# Patient Record
Sex: Male | Born: 1960 | Race: Black or African American | Hispanic: No | Marital: Married | Smoking: Former smoker
Health system: Southern US, Community
[De-identification: ages and names within clinical notes are randomized; demographics above are authoritative.]

## PROBLEM LIST (undated history)

## (undated) DIAGNOSIS — N200 Calculus of kidney: Secondary | ICD-10-CM

## (undated) DIAGNOSIS — K219 Gastro-esophageal reflux disease without esophagitis: Secondary | ICD-10-CM

## (undated) DIAGNOSIS — D126 Benign neoplasm of colon, unspecified: Secondary | ICD-10-CM

## (undated) DIAGNOSIS — I10 Essential (primary) hypertension: Secondary | ICD-10-CM

## (undated) DIAGNOSIS — E785 Hyperlipidemia, unspecified: Secondary | ICD-10-CM

## (undated) DIAGNOSIS — F17200 Nicotine dependence, unspecified, uncomplicated: Secondary | ICD-10-CM

## (undated) DIAGNOSIS — T7840XA Allergy, unspecified, initial encounter: Secondary | ICD-10-CM

## (undated) DIAGNOSIS — L409 Psoriasis, unspecified: Secondary | ICD-10-CM

## (undated) DIAGNOSIS — B001 Herpesviral vesicular dermatitis: Secondary | ICD-10-CM

## (undated) DIAGNOSIS — J302 Other seasonal allergic rhinitis: Secondary | ICD-10-CM

## (undated) DIAGNOSIS — I639 Cerebral infarction, unspecified: Secondary | ICD-10-CM

## (undated) HISTORY — DX: Calculus of kidney: N20.0

## (undated) HISTORY — DX: Essential (primary) hypertension: I10

## (undated) HISTORY — DX: Herpesviral vesicular dermatitis: B00.1

## (undated) HISTORY — PX: NASAL SINUS SURGERY: SHX719

## (undated) HISTORY — DX: Gastro-esophageal reflux disease without esophagitis: K21.9

## (undated) HISTORY — DX: Nicotine dependence, unspecified, uncomplicated: F17.200

## (undated) HISTORY — DX: Psoriasis, unspecified: L40.9

## (undated) HISTORY — PX: KIDNEY STONE SURGERY: SHX686

## (undated) HISTORY — DX: Other seasonal allergic rhinitis: J30.2

## (undated) HISTORY — DX: Allergy, unspecified, initial encounter: T78.40XA

## (undated) HISTORY — DX: Hyperlipidemia, unspecified: E78.5

## (undated) HISTORY — DX: Benign neoplasm of colon, unspecified: D12.6

## (undated) HISTORY — DX: Cerebral infarction, unspecified: I63.9

---

## 2003-10-11 ENCOUNTER — Emergency Department (HOSPITAL_COMMUNITY): Admission: EM | Admit: 2003-10-11 | Discharge: 2003-10-12 | Payer: Self-pay | Admitting: Emergency Medicine

## 2006-01-19 ENCOUNTER — Ambulatory Visit: Payer: Self-pay | Admitting: Family Medicine

## 2006-01-26 ENCOUNTER — Ambulatory Visit: Payer: Self-pay | Admitting: Family Medicine

## 2006-04-04 ENCOUNTER — Ambulatory Visit: Payer: Self-pay | Admitting: Family Medicine

## 2006-05-30 ENCOUNTER — Ambulatory Visit: Payer: Self-pay | Admitting: Family Medicine

## 2007-07-26 ENCOUNTER — Ambulatory Visit: Payer: Self-pay | Admitting: Family Medicine

## 2008-04-08 ENCOUNTER — Ambulatory Visit: Payer: Self-pay | Admitting: Family Medicine

## 2008-04-22 ENCOUNTER — Ambulatory Visit: Payer: Self-pay | Admitting: Family Medicine

## 2008-07-24 ENCOUNTER — Ambulatory Visit: Payer: Self-pay | Admitting: Family Medicine

## 2008-11-18 ENCOUNTER — Ambulatory Visit: Payer: Self-pay | Admitting: Family Medicine

## 2008-11-25 ENCOUNTER — Ambulatory Visit: Payer: Self-pay | Admitting: Family Medicine

## 2008-11-26 ENCOUNTER — Encounter: Admission: RE | Admit: 2008-11-26 | Discharge: 2008-11-26 | Payer: Self-pay | Admitting: Family Medicine

## 2008-12-11 ENCOUNTER — Ambulatory Visit: Payer: Self-pay | Admitting: Family Medicine

## 2008-12-30 ENCOUNTER — Ambulatory Visit: Payer: Self-pay | Admitting: Family Medicine

## 2008-12-31 ENCOUNTER — Emergency Department (HOSPITAL_COMMUNITY): Admission: EM | Admit: 2008-12-31 | Discharge: 2008-12-31 | Payer: Self-pay | Admitting: Emergency Medicine

## 2008-12-31 ENCOUNTER — Encounter: Admission: RE | Admit: 2008-12-31 | Discharge: 2008-12-31 | Payer: Self-pay | Admitting: Family Medicine

## 2009-01-23 ENCOUNTER — Ambulatory Visit (HOSPITAL_BASED_OUTPATIENT_CLINIC_OR_DEPARTMENT_OTHER): Admission: RE | Admit: 2009-01-23 | Discharge: 2009-01-23 | Payer: Self-pay | Admitting: Urology

## 2010-02-11 ENCOUNTER — Ambulatory Visit: Payer: Self-pay | Admitting: Family Medicine

## 2010-02-22 ENCOUNTER — Encounter: Admission: RE | Admit: 2010-02-22 | Discharge: 2010-02-22 | Payer: Self-pay | Admitting: Family Medicine

## 2010-02-22 ENCOUNTER — Ambulatory Visit: Payer: Self-pay | Admitting: Family Medicine

## 2010-06-08 ENCOUNTER — Ambulatory Visit: Payer: Self-pay | Admitting: Family Medicine

## 2010-07-17 ENCOUNTER — Encounter: Payer: Self-pay | Admitting: Otolaryngology

## 2010-09-16 ENCOUNTER — Ambulatory Visit (INDEPENDENT_AMBULATORY_CARE_PROVIDER_SITE_OTHER): Payer: 59 | Admitting: Family Medicine

## 2010-10-03 LAB — URINALYSIS, ROUTINE W REFLEX MICROSCOPIC
Bilirubin Urine: NEGATIVE
Glucose, UA: NEGATIVE mg/dL
Ketones, ur: NEGATIVE mg/dL
Leukocytes, UA: NEGATIVE
Nitrite: NEGATIVE
Protein, ur: NEGATIVE mg/dL
Specific Gravity, Urine: 1.045 — ABNORMAL HIGH (ref 1.005–1.030)
Urobilinogen, UA: 1 mg/dL (ref 0.0–1.0)
pH: 7 (ref 5.0–8.0)

## 2010-10-03 LAB — POCT I-STAT, CHEM 8
BUN: 18 mg/dL (ref 6–23)
Calcium, Ion: 1.15 mmol/L (ref 1.12–1.32)
Chloride: 99 meq/L (ref 96–112)
Creatinine, Ser: 2.2 mg/dL — ABNORMAL HIGH (ref 0.4–1.5)
Glucose, Bld: 106 mg/dL — ABNORMAL HIGH (ref 70–99)
HCT: 39 % (ref 39.0–52.0)
Hemoglobin: 13.3 g/dL (ref 13.0–17.0)
Potassium: 3.3 meq/L — ABNORMAL LOW (ref 3.5–5.1)
Sodium: 138 meq/L (ref 135–145)
TCO2: 29 mmol/L (ref 0–100)

## 2010-10-03 LAB — POCT I-STAT 4, (NA,K, GLUC, HGB,HCT)
Hemoglobin: 13.6 g/dL (ref 13.0–17.0)
Potassium: 3.5 mEq/L (ref 3.5–5.1)
Sodium: 142 mEq/L (ref 135–145)

## 2010-10-03 LAB — URINE MICROSCOPIC-ADD ON

## 2010-11-09 NOTE — Consult Note (Signed)
Jason Robbins, Jason Robbins                 ACCOUNT NO.:  [id number]   MEDICAL RECORD NO.:  [id number]          PATIENT TYPE:  EMS   LOCATION:  ED                           FACILITY:  [facility]   PHYSICIAN:  Sigmund I. Patsi Sears, M.D.DATE OF BIRTH:  08/06/1960   DATE OF CONSULTATION:  12/31/2008  DATE OF DISCHARGE:                                 CONSULTATION   SUBJECTIVE:  Jason Robbins is a 50 year old male, with a history of elevated  cholesterol and hypertension, seen at the [facility] ER  complaining of several days of right flank pain.  The patient has known  calcified gallstone, thought his pain was from the gallstone, but CT  scan shows the patient has a 7 mm right lower ureteral calculus, with  mild hydronephrosis.  Because the patient has a creatinine of 2.2, he  was referred to [facility] Emergency Room was slow emergency room for  evaluation.  The patient has had currently has no nausea, no vomiting.  He has a pain level of 3/10, which has been consistent all day.  He  drinks no soft drinks, no alcohol.  Tobacco:  He  is a current smoker  within the last 1 year.  There is no illicit drug use.   REVIEW OF SYSTEMS:  Significant for flank pain without radiation in the  right side.  The patient has no diarrhea.  No dysuria.  No fever, no  chills.  No gross hematuria.   PHYSICAL EXAMINATION:  Well-developed, well-nourished African American  male in no acute distress.  VITAL SIGNS:  Blood pressure is 170/106, pulse 97, respiratory 20,  temperature is 100.3.  NECK:  Supple, nontender.  CHEST:  Clear to P&A.  ABDOMEN:  Soft, decreased bowel sounds without are organomegaly or  masses.  There is right flank pain.  GENITOURINARY:  Normal.  EXTREMITIES:  Normal.   LABORATORY DATA:  Urinalysis with 0-2 red cells per high-power field.  CBC shows hemoglobin 13.3, hematocrit 39.  BMET shows sodium 138,  potassium 3.3, chloride 99, BUN 80, creatinine 2.2, glucose 106.   IMPRESSION:   Right lower ureteral calculus measuring 7 mm with mild  hydronephrosis, mild to moderate symptoms.  The patient will be treated  with medical expulsive therapy with Flomax, and IV Toradol in the  emergency room,.  He will be followed up in the office on Monday, for  consideration of ureteroscopy and laser fragmentation of stone.  He will  follow up with Dr. Lindie Spruce with regard to his elevated blood pressure in  the emergency room, as well as creatinine of 2.2.  Otherwise, the  patient appears stable.      Sigmund I. Patsi Sears, M.D.  Electronically Signed     SIT/MEDQ  D:  12/31/2008  T:  01/01/2009  Job:  [id number]   cc:   Sharlot Gowda, M.D.  Fax: [phone]

## 2010-11-09 NOTE — Op Note (Signed)
Jason Robbins, Jason Robbins                 ACCOUNT NO.:  [id number]   MEDICAL RECORD NO.:  [id number]          PATIENT TYPE:  AMB   LOCATION:  [facility]                         FACILITY:  [facility]   PHYSICIAN:  Sigmund I. Patsi Sears, M.D.DATE OF BIRTH:  1961/03/12   DATE OF PROCEDURE:  01/23/2009  DATE OF DISCHARGE:                               OPERATIVE REPORT   PREOPERATIVE DIAGNOSIS:  Impacted right lower ureteral calculus.   POSTOPERATIVE DIAGNOSIS:  Impacted right lower ureteral calculus.   OPERATION:  Cystourethroscopy, right retrograde pyelogram  interpretation, right ureteroscopy, laser fragmentation of right distal  ureteral calculus, basket extraction of fragments of distal ureteral  calculus stone, right double-J stent (6 French x 24 cm).   SURGEON:  Tannenbaum.   ANESTHESIA:  General LMA.   PREPARATION:  After appropriate preanesthesia, the patient was brought  to the operating room and placed on the operating room in dorsal supine  position where general LMA anesthesia was introduced.  He was then  replaced in dorsal lithotomy position where the pubis was prepped with  Betadine solution and draped in usual fashion.   REVIEW OF HISTORY:  Jason Robbins is a 50 year old male with a known right  lower ureteral calculus, which has failed to progress, continues to  cause intermittent right flank and right lower quadrant pain with some  urinary intermittency.  He has been followed for several weeks on  medical expulsive therapy, but has failed, and is now for ureteroscopy  and removal of stone.   PROCEDURE:  Cystourethroscopy was accomplished, which shows a normal-  appearing bladder, with normal trigone.  The left ureter was normal.  Left retrograde pyelogram was performed which shows a normal ureter with  no stones within it.  The right UO was distinctly abnormal and appears  to be quite edematous.  It is identified, however, and retrograde  pyelogram reveals a 7-mm stone in the ureteral  tunnel.  Ureteroscopy is  then accomplished and a stone identified.  It appeared to be rectangular  in shape, and laser fragmentation was accomplished to break the stone  into multiple pieces.  A basket was then used to remove the stone  fragments [facility] scientific stone basket).  Following this, repeat  ureteroscopy revealed no stones in the ureter.  Because of the general  edema of the ureteral orifice and the postsurgical edema of the ureter  which is anticipated, I will place a double-J stent.  Therefore, a 6-  [place] x 24-cm right double-J stent was placed under fluoroscopic  control in the renal pelvis and coiled in the bladder.  The suture was  taped to the patient's penis.  The patient was given IV Toradol and had  previously been given B&O suppository.  He was awakened and taken to  recovery room in good condition.     Sigmund I. Patsi Sears, M.D.  Electronically Signed    SIT/MEDQ  D:  01/23/2009  T:  01/24/2009  Job:  [id number]

## 2011-01-19 ENCOUNTER — Encounter: Payer: Self-pay | Admitting: Family Medicine

## 2011-01-19 ENCOUNTER — Ambulatory Visit (INDEPENDENT_AMBULATORY_CARE_PROVIDER_SITE_OTHER): Payer: 59 | Admitting: Medical

## 2011-01-19 ENCOUNTER — Encounter: Payer: Self-pay | Admitting: Medical

## 2011-01-19 VITALS — BP 122/88 | HR 100 | Temp 98.7°F | Ht 72.0 in | Wt 251.0 lb

## 2011-01-19 DIAGNOSIS — R509 Fever, unspecified: Secondary | ICD-10-CM

## 2011-01-19 DIAGNOSIS — J029 Acute pharyngitis, unspecified: Secondary | ICD-10-CM

## 2011-01-19 DIAGNOSIS — J039 Acute tonsillitis, unspecified: Secondary | ICD-10-CM

## 2011-01-19 MED ORDER — AMOXICILLIN 875 MG PO TABS: 875.0000 mg | ORAL_TABLET | Freq: Two times a day (BID) | ORAL | Status: AC

## 2011-01-19 NOTE — Progress Notes (Signed)
  Subjective:     Jason Robbins is a 50 y.o. male who presents for 3 day hx/o malaise.  He started feeling bad Monday with malaise, fever to 101, chills, and took some Nyquil.  By Tuesday had sore throat, sinus pressure, on and off fever, and feeling of post nasal drip.  Sister was in town recently with same symptoms.   He has continued to use OTC sinus medication, but just doesn't feel good.   The following portions of the patient's history were reviewed and updated as appropriate: allergies, current medications, past family history, past medical history, past social history, past surgical history and problem list.  Past Medical History  Diagnosis Date  . Allergy     RHINITIS  . Psoriasis   . Cholelithiasis   . Renal stone   . Dyslipidemia   . Herpes labialis   . Hypertension   . Smoker     Review of Systems Constitutional: +fever, chills, fatigue;  denies sweats Dermatology: denies rash ENT: no runny nose, ear pain, teeth pain Cardiology: denies chest pain, palpitations Respiratory: +slight occasional cough; denies shortness of breath, wheezing,  Gastroenterology: denies abdominal pain, nausea, vomiting, diarrhea Musculoskeletal: denies arthralgias, myalgias  Objective:     Filed Vitals:   01/19/11 0832  BP: 122/88  Pulse: 100  Temp: 98.7 F (37.1 C)    General appearance: no distress, WD/WN, ill-appearing Skin: hot feeling HEENT: normocephalic, conjunctiva/corneas normal, sclerae anicteric, TMs pearly, nares patent, no discharge or erythema, pharynx with erythema, right swollen tonsil, no exudate.  Oral cavity: MMM, no lesions  Neck: supple, +shoddy anterior lymphadenopathy, no thyromegaly Heart: RRR, normal S1, S2, no murmurs Lungs: CTA bilaterally, no wheezes, rhonchi, or rales Abdomen: +bs, soft, non tender, non distended, no masses, no hepatomegaly, no splenomegaly  Laboratory Strep test done. Results:negative.    Assessment:   Encounter Diagnoses  Name  Primary?  . Fever Yes  . Tonsillitis   . Pharyngitis     Plan:   Strep test negative, but given his exam, I suspect possible bacterial etiology.  We will try symptomatic approach first, but if not better in 2-3 days or worse symptom and fever, begin Amoxicillin.  Script printed.  Discussed symptomatic treatment including salt water gargles, warm fluids, rest, hydrate well, can use over-the-counter Tylenol for throat pain, fever, or malaise. If worse or not improving within 2-3 days, call or return.

## 2011-01-24 NOTE — Progress Notes (Signed)
Addended by: Dorthula Perfect on: 01/24/2011 01:01 PM   Modules accepted: Orders

## 2011-02-23 ENCOUNTER — Other Ambulatory Visit: Payer: Self-pay | Admitting: Family Medicine

## 2011-08-01 ENCOUNTER — Encounter (INDEPENDENT_AMBULATORY_CARE_PROVIDER_SITE_OTHER): Payer: Self-pay | Admitting: General Surgery

## 2011-08-01 ENCOUNTER — Ambulatory Visit (INDEPENDENT_AMBULATORY_CARE_PROVIDER_SITE_OTHER): Payer: 59 | Admitting: General Surgery

## 2011-08-01 VITALS — BP 184/114 | HR 104 | Temp 98.8°F | Resp 16 | Ht 72.0 in | Wt 251.8 lb

## 2011-08-01 DIAGNOSIS — K8020 Calculus of gallbladder without cholecystitis without obstruction: Secondary | ICD-10-CM

## 2011-08-01 NOTE — Patient Instructions (Signed)
Cholelithiasis Cholelithiasis (also called gallstones) is a form of gallbladder disease where gallstones form in your gallbladder. The gallbladder is a non-essential organ that stores bile made in the liver, which helps digest fats. Gallstones begin as small crystals and slowly grow into stones. Gallstone pain occurs when the gallbladder spasms, and a gallstone is blocking the duct. Pain can also occur when a stone passes out of the duct.  Women are more likely to develop gallstones than men. Other factors that increase the risk of gallbladder disease are:  Having multiple pregnancies. Physicians sometimes advise removing diseased gallbladders before future pregnancies.   Obesity.   Diets heavy in fried foods and fat.   Increasing age (older than 60).   Prolonged use of medications containing male hormones.   Diabetes mellitus.   Rapid weight loss.   Family history of gallstones (heredity).  SYMPTOMS  Feeling sick to your stomach (nauseous).   Abdominal pain.   Yellowing of the skin (jaundice).   Sudden pain. It may persist from several minutes to several hours.   Worsening pain with deep breathing or when jarred.   Fever.   Tenderness to the touch.  In some cases, when gallstones do not move into the bile duct, people have no pain or symptoms. These are called "silent" gallstones. TREATMENT In severe cases, emergency surgery may be required. HOME CARE INSTRUCTIONS   Only take over-the-counter or prescription medicines for pain, discomfort, or fever as directed by your caregiver.   Follow a low-fat diet until seen again. Fat causes the gallbladder to contract, which can result in pain.   Follow up as instructed. Attacks are almost always recurrent and surgery is usually required for permanent treatment.  SEEK IMMEDIATE MEDICAL CARE IF:   Your pain increases and is not controlled by medications.   You have an oral temperature above 102 F (38.9 C), not controlled by  medication.   You develop nausea and vomiting.  MAKE SURE YOU:   Understand these instructions.   Will watch your condition.   Will get help right away if you are not doing well or get worse.  Document Released: 06/09/2005 Document Revised: 02/23/2011 Document Reviewed: 08/12/2010 [employer] Patient Information 2012 [employer]. 

## 2011-08-01 NOTE — Progress Notes (Signed)
Addended byEmelia Loron on: 08/01/2011 05:13 PM   Modules accepted: Orders

## 2011-08-01 NOTE — Progress Notes (Signed)
Patient ID: Jason Robbins, male   DOB: 05/03/1961, 51 y.o.   MRN: [id number]  Chief Complaint  Patient presents with  . Abdominal Pain    new pt    HPI Jason Robbins is a 51 y.o. male.  Referred by Dr. Alexis Frock HPI This is a 51 year old male who was seen about 11 years ago in our practice for what sounds like gallstones and some abdominal pain. This was thought mostly to be musculoskeletal. He was supposed to come back in 4-6 weeks and never did. He has done fairly well since then. He has had a history of kidney stones for which he has undergone a procedure. About 2-1/2 weeks ago he began to develop some back pain it was underneath the shoulder blade as well as some abdominal pain. This has continued for that time and has been intermittent. It is mostly now when he lies down and it is better now that he started eating a bland diet. He is undergone evaluation by the urologist which showed that he does not have a kidney stone. The pain is still present and again is mostly when he is lying down at night now. It is been made better by going to a bland low-fat diet. He has a CT scan recently which shows cholelithiasis and an ultrasound and a CT scan from 2010 which also showed back. He does have a family history of bile duct cancer.  Past Medical History  Diagnosis Date  . Allergy     RHINITIS  . Psoriasis   . Cholelithiasis   . Renal stone   . Dyslipidemia   . Herpes labialis   . Hypertension   . Smoker     Past Surgical History  Procedure Date  . Nasal sinus surgery   . Kidney stone surgery     Family History  Problem Relation Age of Onset  . Hypertension Mother   . Stroke Mother   . Cancer Mother     biliary duct cancer  . Hypertension Father   . Stroke Father   . Heart disease Sister   . Hypertension Sister     Social History History  Substance Use Topics  . Smoking status: Current Everyday Smoker -- 0.2 packs/day    Types: Cigarettes  . Smokeless tobacco: Never  Used  . Alcohol Use: No    Allergies  Allergen Reactions  . Sulfa Antibiotics     Current Outpatient Prescriptions  Medication Sig Dispense Refill  . fenofibrate 160 MG tablet take 1 tablet by mouth once daily  30 tablet  4  . lisinopril-hydrochlorothiazide (PRINZIDE,ZESTORETIC) 10-12.5 MG per tablet take 1 tablet by mouth once daily  30 tablet  4  . traMADol (ULTRAM) 50 MG tablet Ad lib.      Marland Kitchen triamcinolone (KENALOG) 0.1 % cream Apply topically as needed.        . verapamil (CALAN-SR) 240 MG CR tablet take 1 tablet by mouth once daily  30 tablet  4    Review of Systems Review of Systems  Constitutional: Negative for fever, chills and unexpected weight change.  HENT: Negative for hearing loss, congestion, sore throat, trouble swallowing and voice change.   Eyes: Negative for visual disturbance.  Respiratory: Negative for cough and wheezing.   Cardiovascular: Negative for chest pain, palpitations and leg swelling.  Gastrointestinal: Positive for abdominal pain. Negative for nausea, vomiting, diarrhea, constipation, blood in stool, abdominal distention, anal bleeding and rectal pain.  Genitourinary: Negative for hematuria and  difficulty urinating.  Musculoskeletal: Negative for arthralgias.  Skin: Negative for rash and wound.  Neurological: Negative for seizures, syncope, weakness and headaches.  Hematological: Negative for adenopathy. Does not bruise/bleed easily.  Psychiatric/Behavioral: Negative for confusion.    Blood pressure 184/114, pulse 104, temperature 98.8 F (37.1 C), temperature source Temporal, resp. rate 16, height 6' (1.829 m), weight 251 lb 12.8 oz (114.216 kg).  Physical Exam Physical Exam  Vitals reviewed. Constitutional: He appears well-developed and well-nourished.  Eyes: No scleral icterus.  Neck: Neck supple.  Cardiovascular: Normal rate, regular rhythm and normal heart sounds.   Pulmonary/Chest: Effort normal and breath sounds normal. He has no  wheezes. He has no rales.  Abdominal: Soft. Bowel sounds are normal. He exhibits no mass. There is no tenderness.  Lymphadenopathy:    He has no cervical adenopathy.    Data Reviewed CT scan and prior u/s reviewed COMPLETE ABDOMINAL ULTRASOUND  Comparison: None  Findings:  Gallbladder: Shadowing echogenic lesions in the gallbladder  measure up to 3.6 cm. Gallbladder wall measures 2 mm.  Common bile duct: 4 mm, within normal limits.  Liver: Diffusely increased in echogenicity.  IVC: Visualized.  Pancreas: Tail is obscured by bowel gas. Otherwise negative.  Spleen: 7.6 cm, negative.  Right Kidney: 12.8 cm, negative.  Left Kidney: 13.5 cm, negative.  Abdominal aorta: 2.3 cm, negative.  IMPRESSION:  1. Cholelithiasis without acute cholecystitis.  2. Fatty liver.   Assessment    Cholelithiasis, sx    Plan    I think there is a high likelihood that his symptoms are referable to his gallbladder.    I discussed the procedure in detail.  The patient was given Agricultural engineer.  We discussed the risks and benefits of a laparoscopic cholecystectomy and possible cholangiogram including, but not limited to bleeding, infection, injury to surrounding structures such as the intestine or liver, bile leak, retained gallstones, need to convert to an open procedure, prolonged diarrhea, blood clots such as  DVT, common bile duct injury, anesthesia risks, and possible need for additional procedures.  The likelihood of improvement in symptoms and return to the patient's normal status is good. We discussed the typical post-operative recovery course.     Deona Novitski 08/01/2011, 5:07 PM

## 2011-08-03 ENCOUNTER — Other Ambulatory Visit: Payer: Self-pay | Admitting: Family Medicine

## 2011-08-03 NOTE — Telephone Encounter (Signed)
Patient needs a office visit very soon. Please schedule.

## 2011-08-04 ENCOUNTER — Encounter (INDEPENDENT_AMBULATORY_CARE_PROVIDER_SITE_OTHER): Payer: Self-pay

## 2011-09-17 ENCOUNTER — Other Ambulatory Visit: Payer: Self-pay | Admitting: Medical

## 2011-09-19 ENCOUNTER — Telehealth: Payer: Self-pay | Admitting: Internal Medicine

## 2011-09-19 MED ORDER — LISINOPRIL-HYDROCHLOROTHIAZIDE 10-12.5 MG PO TABS: 1.0000 | ORAL_TABLET | Freq: Every day | ORAL | Status: DC

## 2011-09-19 MED ORDER — VERAPAMIL HCL ER 240 MG PO TBCR: 240.0000 mg | EXTENDED_RELEASE_TABLET | Freq: Every day | ORAL | Status: DC

## 2011-09-19 MED ORDER — FENOFIBRATE 160 MG PO TABS: 160.0000 mg | ORAL_TABLET | Freq: Every day | ORAL | Status: DC

## 2011-09-19 NOTE — Telephone Encounter (Signed)
Medications renewed.

## 2011-11-04 ENCOUNTER — Ambulatory Visit (INDEPENDENT_AMBULATORY_CARE_PROVIDER_SITE_OTHER): Payer: 59 | Admitting: Family Medicine

## 2011-11-04 ENCOUNTER — Encounter: Payer: Self-pay | Admitting: Family Medicine

## 2011-11-04 VITALS — BP 130/90 | HR 87 | Wt 247.0 lb

## 2011-11-04 DIAGNOSIS — Z566 Other physical and mental strain related to work: Secondary | ICD-10-CM | POA: Insufficient documentation

## 2011-11-04 DIAGNOSIS — Z569 Unspecified problems related to employment: Secondary | ICD-10-CM

## 2011-11-04 NOTE — Progress Notes (Signed)
  Subjective:    Patient ID: Jason Robbins, male    DOB: [birth year], 51 y.o.   MRN: [id number]  HPI He is here for consult concerning possible panic attacks. He relates to Timentin episodes of feeling in the fall. He describes this as an inability to prioritize and get is accomplished. This is mainly a problem at work. He further discuss is the fact that he does tend to take on a lot of responsibility and feels that he must never make a mistake. He feels as if he must always be perfect in accomplishing all the tasks of his job. He admits to taking an other responsibilities and constantly feeling responsible for maintaining perfection.  Review of Systems     Objective:   Physical Exam Alert and in no distress and occasionally tearful.      Assessment & Plan:   1. Work-related stress    over 25 minutes spent discussing this with him. He does realize that he has trouble letting go and is a perfectionist. I explained to him that he is setting himself up for sale by making perfection the only goal that he can reach. He did seem to understand this. I encouraged him to get involved in counseling to help learn to set new rules and regulations for work and also at home. I do not feel that medication at this point as needed. He is comfortable with this approach.

## 2012-01-15 ENCOUNTER — Other Ambulatory Visit: Payer: Self-pay | Admitting: Family Medicine

## 2012-09-26 ENCOUNTER — Other Ambulatory Visit: Payer: Self-pay | Admitting: Family Medicine

## 2012-10-08 ENCOUNTER — Telehealth: Payer: Self-pay | Admitting: Family Medicine

## 2012-10-08 MED ORDER — LISINOPRIL-HYDROCHLOROTHIAZIDE 10-12.5 MG PO TABS: 1.0000 | ORAL_TABLET | Freq: Every day | ORAL | Status: DC

## 2012-10-08 MED ORDER — VERAPAMIL HCL ER 240 MG PO TBCR: 240.0000 mg | EXTENDED_RELEASE_TABLET | Freq: Every day | ORAL | Status: DC

## 2012-10-08 MED ORDER — FENOFIBRATE 160 MG PO TABS: 160.0000 mg | ORAL_TABLET | Freq: Every day | ORAL | Status: DC

## 2012-10-08 NOTE — Telephone Encounter (Signed)
Pt just sched cpe in May, but he is out of Verapamil, HCTZ, Phenofibrate.  Please refill to [facility].

## 2012-10-08 NOTE — Telephone Encounter (Signed)
Sent in med for # 30 with 1 refill

## 2012-10-23 ENCOUNTER — Encounter: Payer: Self-pay | Admitting: Internal Medicine

## 2012-11-01 ENCOUNTER — Encounter: Payer: Self-pay | Admitting: Family Medicine

## 2012-11-13 ENCOUNTER — Encounter: Payer: Self-pay | Admitting: Family Medicine

## 2012-11-26 ENCOUNTER — Ambulatory Visit (INDEPENDENT_AMBULATORY_CARE_PROVIDER_SITE_OTHER): Payer: 59 | Admitting: Family Medicine

## 2012-11-26 ENCOUNTER — Encounter: Payer: Self-pay | Admitting: Family Medicine

## 2012-11-26 VITALS — BP 136/84 | HR 90 | Ht 71.0 in | Wt 245.0 lb

## 2012-11-26 DIAGNOSIS — F17200 Nicotine dependence, unspecified, uncomplicated: Secondary | ICD-10-CM | POA: Insufficient documentation

## 2012-11-26 DIAGNOSIS — E785 Hyperlipidemia, unspecified: Secondary | ICD-10-CM | POA: Insufficient documentation

## 2012-11-26 DIAGNOSIS — J301 Allergic rhinitis due to pollen: Secondary | ICD-10-CM | POA: Insufficient documentation

## 2012-11-26 DIAGNOSIS — L408 Other psoriasis: Secondary | ICD-10-CM

## 2012-11-26 DIAGNOSIS — Z0000 Encounter for general adult medical examination without abnormal findings: Secondary | ICD-10-CM

## 2012-11-26 DIAGNOSIS — Z23 Encounter for immunization: Secondary | ICD-10-CM

## 2012-11-26 DIAGNOSIS — I10 Essential (primary) hypertension: Secondary | ICD-10-CM

## 2012-11-26 DIAGNOSIS — L409 Psoriasis, unspecified: Secondary | ICD-10-CM | POA: Insufficient documentation

## 2012-11-26 LAB — COMPREHENSIVE METABOLIC PANEL
AST: 13 U/L (ref 0–37)
BUN: 15 mg/dL (ref 6–23)
Calcium: 9.6 mg/dL (ref 8.4–10.5)
Chloride: 107 mEq/L (ref 96–112)
Creat: 1.12 mg/dL (ref 0.50–1.35)
Total Bilirubin: 0.4 mg/dL (ref 0.3–1.2)

## 2012-11-26 LAB — POCT URINALYSIS DIPSTICK
Bilirubin, UA: NEGATIVE
Glucose, UA: NEGATIVE
Ketones, UA: NEGATIVE
Leukocytes, UA: NEGATIVE

## 2012-11-26 LAB — CBC WITH DIFFERENTIAL/PLATELET
Basophils Absolute: 0.1 10*3/uL (ref 0.0–0.1)
Eosinophils Absolute: 0.4 10*3/uL (ref 0.0–0.7)
Eosinophils Relative: 7 % — ABNORMAL HIGH (ref 0–5)
HCT: 37.2 % — ABNORMAL LOW (ref 39.0–52.0)
Lymphocytes Relative: 56 % — ABNORMAL HIGH (ref 12–46)
MCH: 31.2 pg (ref 26.0–34.0)
MCHC: 34.1 g/dL (ref 30.0–36.0)
MCV: 91.4 fL (ref 78.0–100.0)
Monocytes Absolute: 0.7 10*3/uL (ref 0.1–1.0)
RDW: 13.7 % (ref 11.5–15.5)
WBC: 5.1 10*3/uL (ref 4.0–10.5)

## 2012-11-26 LAB — LIPID PANEL
Cholesterol: 192 mg/dL (ref 0–200)
HDL: 33 mg/dL — ABNORMAL LOW (ref >39)
Total CHOL/HDL Ratio: 5.8 Ratio

## 2012-11-26 MED ORDER — LISINOPRIL-HYDROCHLOROTHIAZIDE 10-12.5 MG PO TABS: 1.0000 | ORAL_TABLET | Freq: Every day | ORAL | Status: DC

## 2012-11-26 MED ORDER — VERAPAMIL HCL ER 240 MG PO TBCR: 240.0000 mg | EXTENDED_RELEASE_TABLET | Freq: Every day | ORAL | Status: DC

## 2012-11-26 MED ORDER — FENOFIBRATE 160 MG PO TABS: 160.0000 mg | ORAL_TABLET | Freq: Every day | ORAL | Status: DC

## 2012-11-26 NOTE — Progress Notes (Signed)
  Subjective:    Patient ID: Jason Robbins, male    DOB: [birth year], 52 y.o.   MRN: [id number]  HPI He is here for complete examination. He continues on medications listed in the chart and is having no difficulty with him. He continues to smoke and at this point is not interested in quitting. His allergies are under good control. He has very little difficulty from his psoriasis. Does have a previous history of stones. His work is going well. His marriage is stable.   Review of Systems  Constitutional: Negative.   HENT: Negative.   Eyes: Negative.   Respiratory: Negative.   Cardiovascular: Negative.   Gastrointestinal: Negative.   Endocrine: Negative.   Genitourinary: Negative.   Allergic/Immunologic: Negative.   Neurological: Negative.   Hematological: Negative.   Psychiatric/Behavioral: Negative.        Objective:   Physical Exam BP 136/84  Pulse 90  Ht 5\' 11"  (1.803 m)  Wt 245 lb (111.131 kg)  BMI 34.19 kg/m2  General Appearance:    Alert, cooperative, no distress, appears stated age  Head:    Normocephalic, without obvious abnormality, atraumatic  Eyes:    PERRL, conjunctiva/corneas clear, EOM's intact, fundi    benign  Ears:    Normal TM's and external ear canals  Nose:   Nares normal, mucosa normal, no drainage or sinus   tenderness  Throat:   Lips, mucosa, and tongue normal; teeth and gums normal  Neck:   Supple, no lymphadenopathy;  thyroid:  no   enlargement/tenderness/nodules; no carotid   bruit or JVD  Back:    Spine nontender, no curvature, ROM normal, no CVA     tenderness  Lungs:     Clear to auscultation bilaterally without wheezes, rales or     ronchi; respirations unlabored  Chest Wall:    No tenderness or deformity   Heart:    Regular rate and rhythm, S1 and S2 normal, no murmur, rub   or gallop  Breast Exam:    No chest wall tenderness, masses or gynecomastia  Abdomen:     Soft, non-tender, nondistended, normoactive bowel sounds,    no masses, no  hepatosplenomegaly  Genitalia:    Normal male external genitalia without lesions.  Testicles without masses.  No inguinal hernias.  Rectal:  deferred  Extremities:   No clubbing, cyanosis or edema  Pulses:   2+ and symmetric all extremities  Skin:   Skin color, texture, turgor normal, no rashes or lesions  Lymph nodes:   Cervical, supraclavicular, and axillary nodes normal  Neurologic:   CNII-XII intact, normal strength, sensation and gait; reflexes 2+ and symmetric throughout          Psych:   Normal mood, affect, hygiene and grooming.          Assessment & Plan:  Routine general medical examination at a health care facility - Plan: POCT urinalysis dipstick, Tdap vaccine greater than or equal to 7yo IM, CBC with Differential, Comprehensive metabolic panel, Lipid panel  Hypertension - Plan: lisinopril-hydrochlorothiazide (PRINZIDE,ZESTORETIC) 10-12.5 MG per tablet, verapamil (CALAN-SR) 240 MG CR tablet, CBC with Differential, Comprehensive metabolic panel  Hyperlipidemia LDL goal < 130 - Plan: fenofibrate 160 MG tablet  Current smoker  Psoriasis  Allergic rhinitis due to pollen colonoscopy and PSA testing discussed. He is not interested in PSA and will schedule colonoscopy in several months when his schedule clears.

## 2012-11-27 NOTE — Progress Notes (Signed)
Quick Note:  CALLED PT CELL # LEFT MESSAGE WORD FOR WORD Labs okay except for slight abnormalities in his CBC. No reason for concern but recommend a multivitamin with iron and recheck in one month ______

## 2013-02-13 ENCOUNTER — Other Ambulatory Visit: Payer: Self-pay | Admitting: Family Medicine

## 2013-08-06 ENCOUNTER — Encounter: Payer: Self-pay | Admitting: Family Medicine

## 2013-08-06 ENCOUNTER — Ambulatory Visit (INDEPENDENT_AMBULATORY_CARE_PROVIDER_SITE_OTHER): Payer: 59 | Admitting: Family Medicine

## 2013-08-06 VITALS — BP 170/120 | HR 96 | Wt 251.0 lb

## 2013-08-06 DIAGNOSIS — M549 Dorsalgia, unspecified: Secondary | ICD-10-CM

## 2013-08-06 NOTE — Patient Instructions (Signed)
Do stretching exercises regularly and keep track of this in terms of position food activity stress

## 2013-08-06 NOTE — Progress Notes (Signed)
   Subjective:    Patient ID: Jason Robbins, male    DOB: 1961-03-15, 53 y.o.   MRN: [id number]  HPI He has a two-week history of intermittent mid back pressure between his shoulder blades. He also is noted some slight shortness of breath with that. The shortness of breath does not change with physical activity. He cannot relate this to any particular time of the day or stressful situation or food.. No fever, chills, cough or congestion. Upon further questioning he did say that motion did have an effect on his back.   Review of Systems     Objective:   Physical Exam Alert and in no distress. Cardiac exam shows regular rhythm without murmurs or gallops. Lungs are clear to auscultation. No posterior back tenderness.       Assessment & Plan:  Mid back pain  this is most likely musculoskeletal. I discussed stretching exercises, possible physical therapy, massage or chiropractic and debilitation. He will keep track of his symptoms to see if he can be more specific. Reassured him that I did not think he was in any danger.

## 2013-08-07 ENCOUNTER — Other Ambulatory Visit: Payer: Self-pay | Admitting: Family Medicine

## 2013-08-08 ENCOUNTER — Ambulatory Visit: Payer: 59 | Admitting: Family Medicine

## 2013-08-21 ENCOUNTER — Other Ambulatory Visit: Payer: Self-pay | Admitting: Family Medicine

## 2013-10-19 ENCOUNTER — Other Ambulatory Visit: Payer: Self-pay | Admitting: Family Medicine

## 2013-10-24 ENCOUNTER — Ambulatory Visit (INDEPENDENT_AMBULATORY_CARE_PROVIDER_SITE_OTHER): Payer: 59 | Admitting: Family Medicine

## 2013-10-24 ENCOUNTER — Encounter: Payer: Self-pay | Admitting: Family Medicine

## 2013-10-24 ENCOUNTER — Ambulatory Visit
Admission: RE | Admit: 2013-10-24 | Discharge: 2013-10-24 | Disposition: A | Discharge status: Home / Self Care | Payer: 59 | Source: Ambulatory Visit | Attending: Family Medicine | Admitting: Family Medicine

## 2013-10-24 VITALS — BP 140/90 | HR 80 | Wt 250.0 lb

## 2013-10-24 DIAGNOSIS — J209 Acute bronchitis, unspecified: Secondary | ICD-10-CM

## 2013-10-24 MED ORDER — AMOXICILLIN 875 MG PO TABS: 875.0000 mg | ORAL_TABLET | Freq: Two times a day (BID) | ORAL | Status: DC

## 2013-10-24 NOTE — Progress Notes (Signed)
   Subjective:    Patient ID: Jason Robbins, male    DOB: 17-Dec-1960, 53 y.o.   MRN: [id number]  HPI He complains of a one-week history of itchy watery eyes but no nasal congestion or rhinorrhea with chest congestion and coughing and within the last 2 days has become a productive cough. He continues to complain of some mid back discomfort again seems to be very vague as to exactly when this gets better or worse. He also states that he does tend to sweat a lot however this is been going on for several years. He also complains of some shortness of breath with physical activity but again no chest pain, heart rate changes, PND. This has been going on for an extended period of time.   Review of Systems     Objective:   Physical Exam alert and in no distress. Tympanic membranes and canals are normal. Throat is clear. Tonsils are normal. Neck is supple without adenopathy or thyromegaly. Cardiac exam shows a regular sinus rhythm without murmurs or gallops. Lungs are clear to auscultation.        Assessment & Plan:  Acute bronchitis - Plan: amoxicillin (AMOXIL) 875 MG tablet, DG Chest 2 View  chest x-ray did show peribronchial markings indicating probable bronchitis. He will call if no improvement. I will reevaluate the chest discomfort if he has no improvement. No particular thoughts on the excessive sweating.

## 2013-10-25 ENCOUNTER — Telehealth: Payer: Self-pay | Admitting: Family Medicine

## 2013-10-25 NOTE — Telephone Encounter (Signed)
lmtc 10-25-2013 jk

## 2013-10-25 NOTE — Telephone Encounter (Signed)
The antibiotic should work for his bronchitis

## 2013-10-25 NOTE — Telephone Encounter (Signed)
Pt was notified and states he will start the amoxicillin as directed.  [id number]

## 2013-11-28 ENCOUNTER — Other Ambulatory Visit: Payer: Self-pay | Admitting: Family Medicine

## 2014-01-23 ENCOUNTER — Other Ambulatory Visit: Payer: Self-pay | Admitting: Family Medicine

## 2014-04-02 ENCOUNTER — Other Ambulatory Visit: Payer: Self-pay | Admitting: Family Medicine

## 2014-04-02 ENCOUNTER — Telehealth: Payer: Self-pay | Admitting: Internal Medicine

## 2014-04-02 NOTE — Telephone Encounter (Signed)
Called and left a message for pt to call and schedule an appt as he has not been in, in a while and once he schedules an appt we can refill his med.  request for verapamil 240mg  to [facility]

## 2014-04-10 ENCOUNTER — Other Ambulatory Visit: Payer: Self-pay

## 2014-04-10 ENCOUNTER — Ambulatory Visit: Payer: 59 | Admitting: Podiatrist

## 2014-04-10 ENCOUNTER — Telehealth: Payer: Self-pay | Admitting: Family Medicine

## 2014-04-10 MED ORDER — VERAPAMIL HCL ER 240 MG PO TBCR: EXTENDED_RELEASE_TABLET | ORAL | Status: DC

## 2014-04-10 NOTE — Telephone Encounter (Signed)
DONE

## 2014-04-16 ENCOUNTER — Ambulatory Visit (INDEPENDENT_AMBULATORY_CARE_PROVIDER_SITE_OTHER): Payer: 59 | Admitting: Family Medicine

## 2014-04-16 ENCOUNTER — Encounter: Payer: Self-pay | Admitting: Family Medicine

## 2014-04-16 VITALS — BP 128/80 | HR 84 | Wt 250.0 lb

## 2014-04-16 DIAGNOSIS — E785 Hyperlipidemia, unspecified: Secondary | ICD-10-CM

## 2014-04-16 DIAGNOSIS — Z125 Encounter for screening for malignant neoplasm of prostate: Secondary | ICD-10-CM

## 2014-04-16 DIAGNOSIS — Z720 Tobacco use: Secondary | ICD-10-CM

## 2014-04-16 DIAGNOSIS — I10 Essential (primary) hypertension: Secondary | ICD-10-CM

## 2014-04-16 DIAGNOSIS — R6882 Decreased libido: Secondary | ICD-10-CM

## 2014-04-16 DIAGNOSIS — F17200 Nicotine dependence, unspecified, uncomplicated: Secondary | ICD-10-CM

## 2014-04-16 LAB — CBC WITH DIFFERENTIAL/PLATELET
BASOS PCT: 1 % (ref 0–1)
Basophils Absolute: 0.1 10*3/uL (ref 0.0–0.1)
EOS ABS: 0.4 10*3/uL (ref 0.0–0.7)
Eosinophils Relative: 7 % — ABNORMAL HIGH (ref 0–5)
HEMATOCRIT: 38.9 % — AB (ref 39.0–52.0)
Hemoglobin: 13.6 g/dL (ref 13.0–17.0)
Lymphocytes Relative: 47 % — ABNORMAL HIGH (ref 12–46)
Lymphs Abs: 2.4 10*3/uL (ref 0.7–4.0)
MCH: 31.9 pg (ref 26.0–34.0)
MCHC: 35 g/dL (ref 30.0–36.0)
MCV: 91.3 fL (ref 78.0–100.0)
MONO ABS: 0.9 10*3/uL (ref 0.1–1.0)
MONOS PCT: 17 % — AB (ref 3–12)
NEUTROS ABS: 1.5 10*3/uL — AB (ref 1.7–7.7)
Neutrophils Relative %: 28 % — ABNORMAL LOW (ref 43–77)
Platelets: 315 10*3/uL (ref 150–400)
RBC: 4.26 MIL/uL (ref 4.22–5.81)
RDW: 13.6 % (ref 11.5–15.5)
WBC: 5.2 10*3/uL (ref 4.0–10.5)

## 2014-04-16 NOTE — Progress Notes (Signed)
   Subjective:    Patient ID: Jason Robbins, male    DOB: 1960-10-06, 53 y.o.   MRN: [id number]  HPI He is here for an interval evaluation. He continues on medications listed in the chart. He recently look at possibly moving to [place] but decided to stay here. He is comfortable with that. He continues to smoke and is presently not ready to quit. He does not want a flu shot and does not want to be scheduled for colonoscopy. He does complain of decreased libido as well as stamina and some fatigue. He has not noted any weakness.   Review of Systems     Objective:   Physical Exam alert and in no distress. Tympanic membranes and canals are normal. Throat is clear. Tonsils are normal. Neck is supple without adenopathy or thyromegaly. Cardiac exam shows a regular sinus rhythm without murmurs or gallops. Lungs are clear to auscultation.        Assessment & Plan:  Essential hypertension - Plan: CBC with Differential, Comprehensive metabolic panel, Lipid panel  Hyperlipidemia with target LDL less than 130 - Plan: Lipid panel  Current smoker  Low libido - Plan: Testosterone  Special screening for malignant neoplasm of prostate - Plan: PSA  I informed him that I am ready to work with him when he is ready to quit smoking.

## 2014-04-17 ENCOUNTER — Encounter: Payer: Self-pay | Admitting: Podiatrist

## 2014-04-17 ENCOUNTER — Other Ambulatory Visit: Payer: 59

## 2014-04-17 ENCOUNTER — Ambulatory Visit (INDEPENDENT_AMBULATORY_CARE_PROVIDER_SITE_OTHER): Payer: 59 | Admitting: Podiatrist

## 2014-04-17 VITALS — BP 145/92 | HR 89 | Resp 14 | Ht 71.0 in | Wt 245.0 lb

## 2014-04-17 DIAGNOSIS — B351 Tinea unguium: Secondary | ICD-10-CM

## 2014-04-17 DIAGNOSIS — M79676 Pain in unspecified toe(s): Secondary | ICD-10-CM

## 2014-04-17 LAB — LIPID PANEL
CHOL/HDL RATIO: 6.4 ratio
CHOLESTEROL: 192 mg/dL (ref 0–200)
HDL: 30 mg/dL — AB (ref >39)
LDL CALC: 109 mg/dL — AB (ref 0–99)
TRIGLYCERIDES: 263 mg/dL — AB (ref <150)
VLDL: 53 mg/dL — AB (ref 0–40)

## 2014-04-17 LAB — TESTOSTERONE: TESTOSTERONE: 174 ng/dL — AB (ref 300–890)

## 2014-04-17 LAB — COMPREHENSIVE METABOLIC PANEL
ALK PHOS: 55 U/L (ref 39–117)
ALT: 17 U/L (ref 0–53)
AST: 12 U/L (ref 0–37)
Albumin: 4.4 g/dL (ref 3.5–5.2)
BILIRUBIN TOTAL: 0.3 mg/dL (ref 0.2–1.2)
BUN: 15 mg/dL (ref 6–23)
CO2: 27 mEq/L (ref 19–32)
CREATININE: 1.21 mg/dL (ref 0.50–1.35)
Calcium: 9.6 mg/dL (ref 8.4–10.5)
Chloride: 104 mEq/L (ref 96–112)
GLUCOSE: 82 mg/dL (ref 70–99)
Potassium: 3.8 mEq/L (ref 3.5–5.3)
Sodium: 140 mEq/L (ref 135–145)
Total Protein: 7.2 g/dL (ref 6.0–8.3)

## 2014-04-17 LAB — PSA: PSA: 0.6 ng/mL (ref <=4.00)

## 2014-04-17 NOTE — Progress Notes (Signed)
   Subjective:    Patient ID: Jason Robbins, male    DOB: [birth year], 53 y.o.   MRN: [id number]  HPI Comments: Pt requests trimming of left 1st toenail.     Review of Systems  All other systems reviewed and are negative.      Objective:   Physical Exam Neurovascular status intact-  Left hallux nail is thick, discolored, dystrophic and painful. It is elongated and dystrophic.      Assessment & Plan:  Mycotic left hallux nail  Plan:  Debridement of digital nail is present.  He will be seen back as needed.

## 2014-04-17 NOTE — Addendum Note (Signed)
Addended by: Denita Lung on: 04/17/2014 10:07 AM   Modules accepted: Orders

## 2014-05-15 ENCOUNTER — Encounter: Payer: Self-pay | Admitting: Medical

## 2014-05-15 ENCOUNTER — Other Ambulatory Visit: Payer: 59

## 2014-05-15 ENCOUNTER — Ambulatory Visit (INDEPENDENT_AMBULATORY_CARE_PROVIDER_SITE_OTHER): Payer: 59 | Admitting: Medical

## 2014-05-15 VITALS — BP 130/90 | HR 78 | Temp 98.0°F | Resp 16 | Wt 248.0 lb

## 2014-05-15 DIAGNOSIS — F17200 Nicotine dependence, unspecified, uncomplicated: Secondary | ICD-10-CM

## 2014-05-15 DIAGNOSIS — Z720 Tobacco use: Secondary | ICD-10-CM

## 2014-05-15 DIAGNOSIS — E291 Testicular hypofunction: Secondary | ICD-10-CM

## 2014-05-15 DIAGNOSIS — R6882 Decreased libido: Secondary | ICD-10-CM

## 2014-05-15 DIAGNOSIS — J209 Acute bronchitis, unspecified: Secondary | ICD-10-CM

## 2014-05-15 DIAGNOSIS — R7989 Other specified abnormal findings of blood chemistry: Secondary | ICD-10-CM

## 2014-05-15 DIAGNOSIS — I10 Essential (primary) hypertension: Secondary | ICD-10-CM

## 2014-05-15 MED ORDER — AZITHROMYCIN 250 MG PO TABS: ORAL_TABLET | ORAL | Status: DC

## 2014-05-15 MED ORDER — VERAPAMIL HCL ER 240 MG PO TBCR: EXTENDED_RELEASE_TABLET | ORAL | Status: DC

## 2014-05-15 MED ORDER — FENOFIBRATE 160 MG PO TABS
160.0000 mg | ORAL_TABLET | Freq: Every day | ORAL | Status: DC
Start: 2014-05-15 — End: 2014-09-22

## 2014-05-15 MED ORDER — LISINOPRIL-HYDROCHLOROTHIAZIDE 10-12.5 MG PO TABS: 1.0000 | ORAL_TABLET | Freq: Every day | ORAL | Status: DC

## 2014-05-15 NOTE — Progress Notes (Signed)
Subjective:  Jason Robbins is a 53 y.o. male who presents for possible bronchitis.  Symptoms  4-5 day hx/o cough, but has had some intermittent cough episodes since April.  Lately, last 4-5 days having productive cough, nasal congestion, scratchy throat, recurrent since April.  Denies fever, chills, SOB, wheezing, NVD, ear pain, headache.  Treatment to date: mucinex.  No sick contacts.   He does smoke.   He denies hx/o asthma or lung disease.   No other aggravating or relieving factors.   Here to recheck TST leve.  At last visit his Testerone was low, has been having some libido changes  There was confusion last visit over pharmacy.  He has changed pharmacies, wants ALL of his medications sent to [place].   No other c/o.   The following portions of the patient's history were reviewed and updated as appropriate: allergies, current medications, past family history, past medical history, past social history, past surgical history and problem list.  ROS as in subjective  Past Medical History  Diagnosis Date  . Allergy     RHINITIS  . Psoriasis   . Cholelithiasis   . Renal stone   . Dyslipidemia   . Herpes labialis   . Hypertension   . Smoker      Objective: BP 130/90 mmHg  Pulse 78  Temp(Src) 98 F (36.7 C) (Oral)  Resp 16  Wt 248 lb (112.492 kg)  General appearance: Alert, WD/WN, no distress                             Skin: warm, no rash, no diaphoresis                           Head: no sinus tenderness                            Eyes: conjunctiva normal, corneas clear, PERRLA                            Ears: pearly TMs, external ear canals normal                          Nose: septum midline, turbinates swollen, with erythema and mucoid discharge             Mouth/throat: MMM, tongue normal, mild pharyngeal erythema                           Neck: supple, no adenopathy, no thyromegaly, nontender                          Heart: RRR, normal S1, S2, no murmurs              Lungs: +bronchial breath sounds, no rhonchi, no wheezes, no rales                Extremities: no edema, nontender     Assessment: Encounter Diagnoses  Name Primary?  . Acute bronchitis, unspecified organism Yes  . Essential hypertension   . Smoker   . Low libido   . Low testosterone      Plan:  Bronchitis - Medication orders today include:Zpak. Discussed diagnosis and treatment of bronchitis.  Suggested  symptomatic OTC remedies for cough and congestion.  Tylenol or Ibuprofen OTC for fever and malaise.  Call/return in 2-3 days if symptoms are worse or not improving.  Advised that cough may linger even after the infection is improved.     Tobacco use - strongly advised cessation, counseled on this for > 12min.  Resent all his medications to new pharmacy at his request  Repeat TST lab today as recent TST was low.  This is for confirmation.

## 2014-05-16 LAB — TESTOSTERONE: Testosterone: 293 ng/dL — ABNORMAL LOW (ref 300–890)

## 2014-06-04 ENCOUNTER — Other Ambulatory Visit: Payer: Self-pay | Admitting: Family Medicine

## 2014-06-05 ENCOUNTER — Ambulatory Visit (INDEPENDENT_AMBULATORY_CARE_PROVIDER_SITE_OTHER): Payer: 59 | Admitting: Family Medicine

## 2014-06-05 DIAGNOSIS — E291 Testicular hypofunction: Secondary | ICD-10-CM | POA: Insufficient documentation

## 2014-06-05 MED ORDER — TESTOSTERONE 20.25 MG/ACT (1.62%) TD GEL: TRANSDERMAL | Status: DC

## 2014-06-05 NOTE — Progress Notes (Signed)
   Subjective:    Patient ID: Jason Robbins, male    DOB: 16-Aug-1960, 53 y.o.   MRN: [id number]  HPI He is here for consult concerning low testosterone. He does admit to having difficulty over the last year or 2 with decreased energy, stamina, strength as well as libido. 2 recent testosterone levels do show low numbers.   Review of Systems     Objective:   Physical Exam Alert and in no distress otherwise not examined       Assessment & Plan:  Hypogonadism in male - Plan: Testosterone (ANDROGEL PUMP) 20.25 MG/ACT (1.62%) GEL  I discussed the diagnosis of hypogonadism and treatment with testosterone. I will place him on AndroGel. Discussed possible side effects with him. He is to use 2 pumps per day and return here in one month for recheck.

## 2014-07-18 ENCOUNTER — Other Ambulatory Visit: Payer: Self-pay | Admitting: Medical

## 2014-09-22 ENCOUNTER — Other Ambulatory Visit: Payer: Self-pay | Admitting: Medical

## 2014-09-28 ENCOUNTER — Other Ambulatory Visit: Payer: Self-pay | Admitting: Family Medicine

## 2014-10-21 ENCOUNTER — Telehealth: Payer: Self-pay | Admitting: Family Medicine

## 2014-10-21 MED ORDER — VERAPAMIL HCL ER 240 MG PO TBCR: 240.0000 mg | EXTENDED_RELEASE_TABLET | Freq: Every day | ORAL | Status: DC

## 2014-10-21 NOTE — Telephone Encounter (Signed)
done

## 2014-10-21 NOTE — Telephone Encounter (Signed)
[facility] [address] requesting 90 day supply on Verapamil ER 240 MG

## 2014-10-31 ENCOUNTER — Telehealth: Payer: Self-pay | Admitting: Internal Medicine

## 2014-10-31 MED ORDER — VERAPAMIL HCL ER 240 MG PO TBCR
240.0000 mg | EXTENDED_RELEASE_TABLET | Freq: Every day | ORAL | Status: DC
Start: 2014-10-31 — End: 2015-02-12

## 2014-10-31 MED ORDER — FENOFIBRATE 160 MG PO TABS: ORAL_TABLET | ORAL | Status: DC

## 2014-10-31 MED ORDER — LISINOPRIL-HYDROCHLOROTHIAZIDE 10-12.5 MG PO TABS: 1.0000 | ORAL_TABLET | Freq: Every day | ORAL | Status: DC

## 2014-10-31 NOTE — Telephone Encounter (Signed)
Refill for a 90 day supply

## 2014-11-19 ENCOUNTER — Telehealth: Payer: Self-pay | Admitting: Family Medicine

## 2014-11-21 NOTE — Telephone Encounter (Signed)
P.A. Irena Reichmann denied, pt must try & fail both Androderm and Axiron. Do you want to switch?

## 2014-11-24 NOTE — Telephone Encounter (Signed)
Call in [place]

## 2014-11-25 NOTE — Telephone Encounter (Signed)
Please advise Axiron dosing/strength and I will call in

## 2014-11-26 NOTE — Telephone Encounter (Signed)
One application in each axilla daily

## 2014-11-26 NOTE — Telephone Encounter (Signed)
Called in [place], pt informed and he will recheck in about a month

## 2015-01-25 ENCOUNTER — Other Ambulatory Visit: Payer: Self-pay | Admitting: Family Medicine

## 2015-02-12 ENCOUNTER — Other Ambulatory Visit: Payer: Self-pay | Admitting: Family Medicine

## 2015-02-19 ENCOUNTER — Ambulatory Visit: Payer: Self-pay | Admitting: Family Medicine

## 2015-02-20 ENCOUNTER — Encounter: Payer: Self-pay | Admitting: Family Medicine

## 2015-02-20 ENCOUNTER — Ambulatory Visit (INDEPENDENT_AMBULATORY_CARE_PROVIDER_SITE_OTHER): Payer: 59 | Admitting: Family Medicine

## 2015-02-20 VITALS — BP 120/70 | HR 71 | Wt 238.8 lb

## 2015-02-20 DIAGNOSIS — E291 Testicular hypofunction: Secondary | ICD-10-CM | POA: Diagnosis not present

## 2015-02-20 DIAGNOSIS — Z23 Encounter for immunization: Secondary | ICD-10-CM

## 2015-02-20 DIAGNOSIS — E785 Hyperlipidemia, unspecified: Secondary | ICD-10-CM

## 2015-02-20 NOTE — Progress Notes (Signed)
   Subjective:    Patient ID: Jason Robbins, male    DOB: Sep 27, 1960, 54 y.o.   MRN: [id number]  HPI He is here for consult concerning medications. He does have a history of hypogonadism but has not started on the medication. He is concerned that his fenofibrate might be causing some of his aches and pains. He does complain of weakness and fatiguerather than pain.He has been doing a lot of reading on the Internet concerning this.Also of note is he has recently retired. 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   Review of Systems     Objective:   Physical Exam Alert and in no distress otherwise not examined       Assessment & Plan:  Hypogonadism in male - Plan: Testosterone  Need for prophylactic vaccination and inoculation against influenza - Plan: Flu Vaccine QUAD 36+ mos IM  Hyperlipidemia with target LDL less than 130 I had a long discussion with him concerning weakness and fatigue. I will retest his testosterone level. Also discussed him stopping the fenofibrate for a week and see if it makes a difference in his toes. I will call him with the results of the testosterone.

## 2015-02-21 LAB — TESTOSTERONE: Testosterone: 216 ng/dL — ABNORMAL LOW (ref 300–890)

## 2015-02-22 ENCOUNTER — Encounter: Payer: Self-pay | Admitting: Family Medicine

## 2015-04-23 ENCOUNTER — Encounter: Payer: Self-pay | Admitting: Family Medicine

## 2015-04-23 ENCOUNTER — Ambulatory Visit (INDEPENDENT_AMBULATORY_CARE_PROVIDER_SITE_OTHER): Payer: 59 | Admitting: Family Medicine

## 2015-04-23 VITALS — BP 160/90 | HR 76 | Ht 71.5 in | Wt 233.0 lb

## 2015-04-23 DIAGNOSIS — Z79899 Other long term (current) drug therapy: Secondary | ICD-10-CM

## 2015-04-23 DIAGNOSIS — I10 Essential (primary) hypertension: Secondary | ICD-10-CM

## 2015-04-23 DIAGNOSIS — E291 Testicular hypofunction: Secondary | ICD-10-CM

## 2015-04-23 DIAGNOSIS — Z823 Family history of stroke: Secondary | ICD-10-CM

## 2015-04-23 NOTE — Progress Notes (Signed)
   Subjective:    Patient ID: Jason Robbins, male    DOB: 1961-02-26, 54 y.o.   MRN: [id number]  HPI He is here for recheck. He is now been on testosterone replacement for approximately one month. He does note an increase in energy, stamina as well as libido. He does have concerns over risk for heart disease and does have a family history of CVA. He did read concerning testosterone increasing this risk. He also has underlying hypertension and is on medication for this.   Review of Systems     Objective:   Physical Exam Alert and in no distress otherwise not examined       Assessment & Plan:  Family history of CVA  Hypogonadism in male - Plan: Testosterone  Encounter for long-term (current) use of medications - Plan: Testosterone  Essential hypertension  testosterone will be checked today. We will continue to monitor her blood pressure. I discussed the risks of testosterone regard to heart disease and CVA. Follow-up pending results of blood work.

## 2015-04-24 LAB — TESTOSTERONE: Testosterone: 451 ng/dL (ref 300–890)

## 2015-05-11 ENCOUNTER — Other Ambulatory Visit: Payer: Self-pay | Admitting: Family Medicine

## 2015-05-14 ENCOUNTER — Other Ambulatory Visit: Payer: Self-pay | Admitting: Family Medicine

## 2015-06-28 HISTORY — PX: COLONOSCOPY: SHX174

## 2015-08-07 ENCOUNTER — Other Ambulatory Visit: Payer: Self-pay | Admitting: Family Medicine

## 2015-08-07 NOTE — Telephone Encounter (Signed)
Is this ok to refill?  

## 2015-08-09 NOTE — Telephone Encounter (Signed)
Ok to renew with 2 refills

## 2015-08-10 ENCOUNTER — Telehealth: Payer: Self-pay | Admitting: *Deleted

## 2015-08-10 NOTE — Telephone Encounter (Signed)
Dr Redmond School Mr Lanson is wondering if you could check his levels before he begins another RX. He said he's hoping if his #'s are good then he could stop taking the Axiron/Testim

## 2015-08-10 NOTE — Telephone Encounter (Signed)
His last testing did have the testosterone in the normal range. Let him know that we do not cure this but control it. He needs to stay on the medication to keep his numbers in a good range

## 2015-08-10 NOTE — Telephone Encounter (Signed)
Dee called in

## 2015-08-10 NOTE — Telephone Encounter (Signed)
Switch him to Testim ,one tube per day and recheck in one month

## 2015-08-10 NOTE — Telephone Encounter (Signed)
Dr Redmond School Mr [place] insurance no longer covers the Axiron 30 mg. They suggest Androderm or Testin.

## 2015-08-11 ENCOUNTER — Other Ambulatory Visit: Payer: Self-pay | Admitting: *Deleted

## 2015-08-12 ENCOUNTER — Telehealth: Payer: Self-pay | Admitting: Family Medicine

## 2015-08-12 NOTE — Telephone Encounter (Signed)
P.A. TESTIM °

## 2015-08-14 NOTE — Telephone Encounter (Signed)
P.A. Approved til 02/09/16, faxed pharmacy, left message for pt

## 2015-08-17 ENCOUNTER — Telehealth: Payer: Self-pay | Admitting: Family Medicine

## 2015-08-17 MED ORDER — TRIAMCINOLONE ACETONIDE 0.1 % EX CREA: TOPICAL_CREAM | CUTANEOUS | Status: DC

## 2015-08-17 NOTE — Telephone Encounter (Signed)
Received fax from [facility]. Triamcinolone 0.1% #15

## 2015-08-24 ENCOUNTER — Other Ambulatory Visit: Payer: Self-pay | Admitting: Family Medicine

## 2015-08-25 ENCOUNTER — Other Ambulatory Visit: Payer: Self-pay | Admitting: *Deleted

## 2015-08-25 MED ORDER — VERAPAMIL HCL ER 240 MG PO CP24: 240.0000 mg | ORAL_CAPSULE | Freq: Every day | ORAL | Status: DC

## 2015-08-25 NOTE — Telephone Encounter (Signed)
Dr Redmond School ok for refill  The source prescription has been discontinued.     Sig: TAKE 1 TABLET (240 MG TOTAL) BY MOUTH AT BEDTIME.    Dispense: 90 capsule   Refills: 0   Start: 08/24/2015   Class: Normal    Requested on: 05/11/2015    Originally ordered on: 01/19/2011

## 2015-09-14 ENCOUNTER — Other Ambulatory Visit: Payer: Self-pay

## 2015-09-14 ENCOUNTER — Ambulatory Visit (INDEPENDENT_AMBULATORY_CARE_PROVIDER_SITE_OTHER): Payer: 59 | Admitting: Family Medicine

## 2015-09-14 ENCOUNTER — Encounter: Payer: Self-pay | Admitting: Family Medicine

## 2015-09-14 ENCOUNTER — Encounter (HOSPITAL_COMMUNITY): Payer: Self-pay | Admitting: Emergency Medicine

## 2015-09-14 ENCOUNTER — Emergency Department (HOSPITAL_COMMUNITY)
Admission: EM | Admit: 2015-09-14 | Discharge: 2015-09-14 | Disposition: A | Discharge status: Home / Self Care | Payer: Self-pay | Attending: Emergency Medicine | Admitting: Emergency Medicine

## 2015-09-14 ENCOUNTER — Telehealth: Payer: Self-pay | Admitting: Family Medicine

## 2015-09-14 VITALS — BP 150/88 | HR 72 | Wt 234.4 lb

## 2015-09-14 DIAGNOSIS — F17210 Nicotine dependence, cigarettes, uncomplicated: Secondary | ICD-10-CM | POA: Insufficient documentation

## 2015-09-14 DIAGNOSIS — I10 Essential (primary) hypertension: Secondary | ICD-10-CM

## 2015-09-14 DIAGNOSIS — E291 Testicular hypofunction: Secondary | ICD-10-CM

## 2015-09-14 LAB — BASIC METABOLIC PANEL
Anion gap: 10 (ref 5–15)
BUN: 12 mg/dL (ref 6–20)
CHLORIDE: 105 mmol/L (ref 101–111)
CO2: 27 mmol/L (ref 22–32)
Calcium: 9.6 mg/dL (ref 8.9–10.3)
Creatinine, Ser: 1.11 mg/dL (ref 0.61–1.24)
GFR calc Af Amer: >60 mL/min (ref >60)
GFR calc non Af Amer: >60 mL/min (ref >60)
GLUCOSE: 108 mg/dL — AB (ref 65–99)
POTASSIUM: 3.4 mmol/L — AB (ref 3.5–5.1)
Sodium: 142 mmol/L (ref 135–145)

## 2015-09-14 LAB — CBC
HCT: 38.4 % — ABNORMAL LOW (ref 39.0–52.0)
HEMOGLOBIN: 12.9 g/dL — AB (ref 13.0–17.0)
MCH: 31.7 pg (ref 26.0–34.0)
MCHC: 33.6 g/dL (ref 30.0–36.0)
MCV: 94.3 fL (ref 78.0–100.0)
Platelets: 275 10*3/uL (ref 150–400)
RBC: 4.07 MIL/uL — AB (ref 4.22–5.81)
RDW: 12.7 % (ref 11.5–15.5)
WBC: 5.7 10*3/uL (ref 4.0–10.5)

## 2015-09-14 LAB — I-STAT TROPONIN, ED: TROPONIN I, POC: 0 ng/mL (ref 0.00–0.08)

## 2015-09-14 LAB — TESTOSTERONE: TESTOSTERONE: 245 ng/dL — AB (ref 250–827)

## 2015-09-14 MED ORDER — LISINOPRIL-HYDROCHLOROTHIAZIDE 20-12.5 MG PO TABS: 1.0000 | ORAL_TABLET | Freq: Every day | ORAL | Status: DC

## 2015-09-14 NOTE — ED Notes (Signed)
Pt states he is leaving since BP is down and will follow-up with PCP to get results of blood work and EKG.  Encouraged pt to stay and he declines.

## 2015-09-14 NOTE — Telephone Encounter (Signed)
Pt informed of choices,  He will wait til lab results are in and then let us know what he wants to do regarding Testim or generic

## 2015-09-14 NOTE — Progress Notes (Signed)
   Subjective:    Patient ID: Jason Robbins, male    DOB: [birth year], 55 y.o.   MRN: [id number]  HPI He is here for consult concerning his blood pressure. Apparently yesterday he noted a salty taste in his mouth and checked his blood pressure. It was elevated. He did go to the emergency room however he was there for 5 hours and apparently was not seen. They did blood work and an EKG. I did review that. He also needs up on his hypogonadism. He has been out of his testosterone for approximately 1 month due to insurance issues. He is not sure whether he needs this stating he has not noted any change in his overall health that he stopped taking the medication.   Review of Systems     Objective:   Physical Exam Alert and in no distress. Blood pressure is recorded.       Assessment & Plan:  Essential hypertension - Plan: lisinopril-hydrochlorothiazide (ZESTORETIC) 20-12.5 MG tablet  Hypogonadism in male - Plan: Testosterone I will increase his lisinopril to see if that'll help stabilize his blood pressure. Will also check testosterone to see what his level is. Follow-up left leg 1 month. Urge him that the salty taste in his mouth was not related to his blood pressure.

## 2015-09-14 NOTE — Telephone Encounter (Signed)
Called pt but due to situation with ED today, I made appt for today with Dr. Redmond School & will discuss testosterone issue when he comes in

## 2015-09-14 NOTE — Telephone Encounter (Signed)
Recv'd fax for Testosterone 50 mg [facility]  [facility] and went thru but co pay $542, pt has high deductible plan.  I had them run as generic and was $396.

## 2015-09-14 NOTE — ED Notes (Signed)
C/o BP 214/114 at home.  States he has had a salty taste in his mouth all day every since eating breakfast at [facility] which tasted salty to him.  Denies pain.

## 2015-09-20 ENCOUNTER — Telehealth: Payer: Self-pay | Admitting: Family Medicine

## 2015-09-20 NOTE — Telephone Encounter (Signed)
Went ahead & tried P.A. Generic Testim and was denied, plan exclusion.  Pt is aware

## 2015-10-01 ENCOUNTER — Encounter: Payer: Self-pay | Admitting: Family Medicine

## 2015-10-01 ENCOUNTER — Ambulatory Visit (INDEPENDENT_AMBULATORY_CARE_PROVIDER_SITE_OTHER): Payer: 59 | Admitting: Family Medicine

## 2015-10-01 VITALS — BP 150/100 | HR 98 | Wt 232.0 lb

## 2015-10-01 DIAGNOSIS — B029 Zoster without complications: Secondary | ICD-10-CM

## 2015-10-01 DIAGNOSIS — Z209 Contact with and (suspected) exposure to unspecified communicable disease: Secondary | ICD-10-CM | POA: Diagnosis not present

## 2015-10-01 NOTE — Progress Notes (Signed)
   Subjective:    Patient ID: Jason Robbins, male    DOB: Oct 28, 1960, 55 y.o.   MRN: [id number]  HPI He complains of itching and a rash in the right buttock area. This started approximately 4 days ago. He also admits to having sexual behavior but cannot relate it specifically to the rash. 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   Review of Systems     Objective:   Physical Exam Exam of the buttock does show a 3 cm erythematous vesicular lesion present. Another less erythematous lesion is noted further down the leg posteriorly.       Assessment & Plan:  Shingles  Contact with or exposure to communicable disease - Plan: HIV antibody, RPR Shingles in the S2 distribution. I explained the fact that this is too late to start treating the medication. Explained this to him in detail. Will also do HIV and RPR be safe.

## 2015-10-02 ENCOUNTER — Ambulatory Visit: Payer: 59 | Admitting: Family Medicine

## 2015-10-02 LAB — HIV ANTIBODY (ROUTINE TESTING W REFLEX): HIV 1&2 Ab, 4th Generation: NONREACTIVE

## 2015-10-03 LAB — RPR

## 2015-10-22 ENCOUNTER — Encounter: Payer: Self-pay | Admitting: Family Medicine

## 2015-10-29 ENCOUNTER — Encounter: Payer: Self-pay | Admitting: Family Medicine

## 2015-10-29 ENCOUNTER — Encounter: Payer: Self-pay | Admitting: Gastroenterology

## 2015-10-29 ENCOUNTER — Ambulatory Visit (INDEPENDENT_AMBULATORY_CARE_PROVIDER_SITE_OTHER): Payer: 59 | Admitting: Family Medicine

## 2015-10-29 VITALS — BP 150/90 | HR 68 | Wt 228.0 lb

## 2015-10-29 DIAGNOSIS — Z1211 Encounter for screening for malignant neoplasm of colon: Secondary | ICD-10-CM | POA: Diagnosis not present

## 2015-10-29 DIAGNOSIS — R109 Unspecified abdominal pain: Secondary | ICD-10-CM | POA: Diagnosis not present

## 2015-10-29 NOTE — Progress Notes (Signed)
Subjective:     Patient ID: Jason Robbins, male   DOB: May 06, 1961, 55 y.o.   MRN: [id number]  HPI Jason Robbins is a 55 yo man with known gallstones by ultrasound in 2010 who presents to clinic today with a 2-3 month history of uncomfortable bloating and 2 episodes of severe periumbilical abdominal pain.  His bloating feeling is somewhat constant but he feels that they worsen with fried foods or red meat and slightly improve with bowel movements. He denies fever, blood in his stool, and weight loss.  He is lactose intolerant and has symptoms of bloating and diarrhea when he eats dairy products, but denies any diarrhea / changes in his stools, or frequency of bowel movements.  He has only vomited once during an episode of abdominal pain. He has tried OTC antacids including zantac with no improvement.  He has not had his screening colonoscopy.It was scheduled however he canceled it.  He denies alcohol use and smokes around 1/2 pack /day.    Review of Systems      Objective:   Physical Exam  Alert and in no distress.  Abdomen is non-tender to palpation.  No HSM. Normal bowel sounds appreciated.Murphy sign and Murphy's punch are negative Cardiac exam shows a regular sinus rhythm without murmurs or gallops.  Lungs are clear to auscultation bilaterally.      Assessment/Plan:     Abdominal pain, unspecified abdominal location - Plan: US Abdomen Complete, CANCELED: US Abdomen Limited  Screening for colon cancer - Plan: Ambulatory referral to Gastroenterology   Abd pain Given his history of gallstones and fatty food related symptoms, we will get an ultrasound of the gallbladder to reassess his cholelithiasis.  He will also be rescheduled for colonoscopy.      History and physical exam conducted by Marylen Ponto (Medical Student) in conjunction with Dr. Redmond School.

## 2015-11-05 ENCOUNTER — Ambulatory Visit (HOSPITAL_COMMUNITY): Payer: 59

## 2015-11-06 ENCOUNTER — Ambulatory Visit (HOSPITAL_COMMUNITY)
Admission: RE | Admit: 2015-11-06 | Discharge: 2015-11-06 | Disposition: A | Discharge status: Home / Self Care | Payer: 59 | Source: Ambulatory Visit | Attending: Family Medicine | Admitting: Family Medicine

## 2015-11-06 DIAGNOSIS — K8020 Calculus of gallbladder without cholecystitis without obstruction: Secondary | ICD-10-CM | POA: Insufficient documentation

## 2015-11-06 DIAGNOSIS — R109 Unspecified abdominal pain: Secondary | ICD-10-CM | POA: Diagnosis not present

## 2015-11-17 ENCOUNTER — Encounter: Payer: Self-pay | Admitting: Family Medicine

## 2015-11-20 ENCOUNTER — Other Ambulatory Visit: Payer: Self-pay | Admitting: Family Medicine

## 2015-12-11 ENCOUNTER — Ambulatory Visit (AMBULATORY_SURGERY_CENTER): Payer: 59

## 2015-12-11 VITALS — Ht 71.0 in | Wt 227.6 lb

## 2015-12-11 DIAGNOSIS — Z1211 Encounter for screening for malignant neoplasm of colon: Secondary | ICD-10-CM

## 2015-12-11 MED ORDER — SUPREP BOWEL PREP KIT 17.5-3.13-1.6 GM/177ML PO SOLN: 1.0000 | Freq: Once | ORAL | Status: DC

## 2015-12-11 NOTE — Progress Notes (Signed)
No allergies to eggs or soy No diet meds No home oxygen No past problems with anesthesia EXCEPT BAD EXPERIENCE DURING SINUS SURGERY-WOKE UP AND WAS AWARE BUT COULD NOT RESPOND-1980'S-HAS HAD SURGERY SINCE WITHOUT PROBLEM  Has email and internet; registered for emmi

## 2015-12-14 ENCOUNTER — Encounter: Payer: Self-pay | Admitting: Gastroenterology

## 2015-12-21 ENCOUNTER — Encounter: Payer: Self-pay | Admitting: Gastroenterology

## 2015-12-21 ENCOUNTER — Ambulatory Visit (AMBULATORY_SURGERY_CENTER): Payer: 59 | Admitting: Gastroenterology

## 2015-12-21 VITALS — BP 140/91 | HR 68 | Temp 98.6°F | Resp 28 | Ht 71.0 in | Wt 227.0 lb

## 2015-12-21 DIAGNOSIS — D120 Benign neoplasm of cecum: Secondary | ICD-10-CM

## 2015-12-21 DIAGNOSIS — Z1211 Encounter for screening for malignant neoplasm of colon: Secondary | ICD-10-CM

## 2015-12-21 DIAGNOSIS — D126 Benign neoplasm of colon, unspecified: Secondary | ICD-10-CM

## 2015-12-21 HISTORY — DX: Benign neoplasm of colon, unspecified: D12.6

## 2015-12-21 MED ORDER — SODIUM CHLORIDE 0.9 % IV SOLN: 500.0000 mL | INTRAVENOUS | Status: DC

## 2015-12-21 NOTE — Progress Notes (Signed)
A/ox3, pleased with MAC, report to RN 

## 2015-12-21 NOTE — Progress Notes (Signed)
Called to room to assist during endoscopic procedure.  Patient ID and intended procedure confirmed with present staff. Received instructions for my participation in the procedure from the performing physician.  

## 2015-12-21 NOTE — Op Note (Signed)
[facility] Patient Name: Jason Robbins Procedure Date: 12/21/2015 1:54 PM MRN: [id number] Endoscopist: Ladene Artist , MD Age: 55 Referring MD:  Date of Birth: [birth year] Gender: Male Account #: [id number] Procedure:                Colonoscopy Indications:              Screening for colorectal malignant neoplasm Medicines:                Monitored Anesthesia Care Procedure:                Pre-Anesthesia Assessment:                           - Prior to the procedure, a History and Physical                            was performed, and patient medications and                            allergies were reviewed. The patient's tolerance of                            previous anesthesia was also reviewed. The risks                            and benefits of the procedure and the sedation                            options and risks were discussed with the patient.                            All questions were answered, and informed consent                            was obtained. Prior Anticoagulants: The patient has                            taken no previous anticoagulant or antiplatelet                            agents. ASA Grade Assessment: II - A patient with                            mild systemic disease. After reviewing the risks                            and benefits, the patient was deemed in                            satisfactory condition to undergo the procedure.                           After obtaining informed consent, the colonoscope  was passed under direct vision. Throughout the                            procedure, the patient's blood pressure, pulse, and                            oxygen saturations were monitored continuously. The                            Model PCF-H190L [id number]) scope was introduced                            through the anus and advanced to the the cecum,                            identified by  appendiceal orifice and ileocecal                            valve. The ileocecal valve, appendiceal orifice,                            and rectum were photographed. The quality of the                            bowel preparation was adequate. The colonoscopy was                            performed without difficulty. The patient tolerated                            the procedure well. Scope In: 2:02:44 PM Scope Out: 2:16:38 PM Scope Withdrawal Time: 0 hours 12 minutes 1 second  Total Procedure Duration: 0 hours 13 minutes 54 seconds  Findings:                 A 6 mm polyp was found in the ileocecal valve. The                            polyp was sessile. The polyp was removed with a                            cold snare. Resection and retrieval were complete.                           Internal hemorrhoids were found during                            retroflexion. The hemorrhoids were small and Grade                            I (internal hemorrhoids that do not prolapse).                           The exam was otherwise without abnormality on  direct and retroflexion views. Complications:            No immediate complications. Estimated blood loss:                            None. Estimated Blood Loss:     Estimated blood loss: none. Impression:               - One 6 mm polyp at the ileocecal valve, removed                            with a cold snare. Resected and retrieved.                           - Internal hemorrhoids.                           - The examination was otherwise normal on direct                            and retroflexion views. Recommendation:           - Repeat colonoscopy in 5 years for surveillance                            with a more extensive bowel prep.                           - Patient has a contact number available for                            emergencies. The signs and symptoms of potential                             delayed complications were discussed with the                            patient. Return to normal activities tomorrow.                            Written discharge instructions were provided to the                            patient.                           - Resume previous diet.                           - Continue present medications.                           - Await pathology results. Ladene Artist, MD 12/21/2015 2:21:08 PM This report has been signed electronically.

## 2015-12-21 NOTE — Patient Instructions (Signed)
YOU HAD AN ENDOSCOPIC PROCEDURE TODAY AT [facility]:   Refer to the procedure report that was given to you for any specific questions about what was found during the examination.  If the procedure report does not answer your questions, please call your gastroenterologist to clarify.  If you requested that your care partner not be given the details of your procedure findings, then the procedure report has been included in a sealed envelope for you to review at your convenience later.  YOU SHOULD EXPECT: Some feelings of bloating in the abdomen. Passage of more gas than usual.  Walking can help get rid of the air that was put into your GI tract during the procedure and reduce the bloating. If you had a lower endoscopy (such as a colonoscopy or flexible sigmoidoscopy) you may notice spotting of blood in your stool or on the toilet paper. If you underwent a bowel prep for your procedure, you may not have a normal bowel movement for a few days.  Please Note:  You might notice some irritation and congestion in your nose or some drainage.  This is from the oxygen used during your procedure.  There is no need for concern and it should clear up in a day or so.  SYMPTOMS TO REPORT IMMEDIATELY:   Following lower endoscopy (colonoscopy or flexible sigmoidoscopy):  Excessive amounts of blood in the stool  Significant tenderness or worsening of abdominal pains  Swelling of the abdomen that is new, acute  Fever of 100F or higher  For urgent or emergent issues, a gastroenterologist can be reached at any hour by calling [phone].   DIET: Your first meal following the procedure should be a small meal and then it is ok to progress to your normal diet. Heavy or fried foods are harder to digest and may make you feel nauseous or bloated.  Likewise, meals heavy in dairy and vegetables can increase bloating.  Drink plenty of fluids but you should avoid alcoholic beverages for 24  hours.  ACTIVITY:  You should plan to take it easy for the rest of today and you should NOT DRIVE or use heavy machinery until tomorrow (because of the sedation medicines used during the test).    FOLLOW UP: Our staff will call the number listed on your records the next business day following your procedure to check on you and address any questions or concerns that you may have regarding the information given to you following your procedure. If we do not reach you, we will leave a message.  However, if you are feeling well and you are not experiencing any problems, there is no need to return our call.  We will assume that you have returned to your regular daily activities without incident.  If any biopsies were taken you will be contacted by phone or by letter within the next 1-3 weeks.  Please call us at [phone] if you have not heard about the biopsies in 3 weeks.    SIGNATURES/CONFIDENTIALITY: You and/or your care partner have signed paperwork which will be entered into your electronic medical record.  These signatures attest to the fact that that the information above on your After Visit Summary has been reviewed and is understood.  Full responsibility of the confidentiality of this discharge information lies with you and/or your care-partner.  Read all of the handouts given to you by your recovery room nurse.  Thank-you for choosing [place] for your healthcare needs today.

## 2015-12-22 ENCOUNTER — Telehealth: Payer: Self-pay | Admitting: *Deleted

## 2015-12-22 NOTE — Telephone Encounter (Signed)
See note. Intermittent abd pain, N/V. Please schedule EGD in [place] for him

## 2015-12-22 NOTE — Telephone Encounter (Signed)
  Follow up Call-  Call back number 12/21/2015  Post procedure Call Back phone  # [phone]  Permission to leave phone message Yes     Patient questions:  Do you have a fever, pain , or abdominal swelling? No. Pain Score  0 *  Have you tolerated food without any problems? No.  Have you been able to return to your normal activities? Yes.    Do you have any questions about your discharge instructions: Diet   Yes.   Medications  No. Follow up visit  Yes.    Do you have questions or concerns about your Care? Yes.    Actions: * If pain score is 4 or above: Physician/ provider Notified : Lucio Edward, MD   Pt. Is concerned that he had episode of vomiting yesterday after colonoscopy.  States he feels "fine" this AM.  His concern appears to stem from the upper abdominal pain that he has been experiencing periodically.  He also states he's had A few episodes of pain, nausea and vomiting. He relates his problems to occuring after eating certain foods. He states that Dr. Redmond School recommended colonoscopy before possible cholecystectomy.  He has gallstones.  He is concerned that he may have an ulcer that could be causing some of his pain.  He wonders if it would be reasonable to schedule upper GI endoscopy to prior to scheduling cholecystecomy. Please advise.

## 2015-12-22 NOTE — Telephone Encounter (Signed)
Left a message for patient to return my call. 

## 2015-12-23 NOTE — Telephone Encounter (Signed)
Left message for patient to return my call.

## 2015-12-23 NOTE — Telephone Encounter (Signed)
Informed patient of [name] recommendations. Patient states he is not sure if he wants to proceed with a direct EGD. He would really like to sit down and discuss this with [name]. He is not sure if some of these symptoms are coming from the gallbladder or his stomach. Patient scheduled f/u visit with [name] on 02/24/16 at 2:15pm.

## 2015-12-23 NOTE — Telephone Encounter (Signed)
Patient returned phone call. Best # [phone]

## 2016-01-03 ENCOUNTER — Encounter: Payer: Self-pay | Admitting: Gastroenterology

## 2016-01-18 ENCOUNTER — Encounter: Payer: Self-pay | Admitting: Family Medicine

## 2016-01-18 ENCOUNTER — Other Ambulatory Visit: Payer: Self-pay

## 2016-01-18 DIAGNOSIS — R1011 Right upper quadrant pain: Secondary | ICD-10-CM

## 2016-01-26 ENCOUNTER — Telehealth: Payer: Self-pay | Admitting: Gastroenterology

## 2016-01-26 NOTE — Telephone Encounter (Signed)
Spoke with patient about his symptoms.  He has been referred to a surgeon for cholecystectomy.  He would really like to speak with [name] prior to having surgery.  He will keep his appt with [name] and the surgeon, he will hold off on making any decisions on surgery until he sees [name] and he can help advise him.

## 2016-02-24 ENCOUNTER — Encounter: Payer: Self-pay | Admitting: Gastroenterology

## 2016-02-24 ENCOUNTER — Ambulatory Visit (INDEPENDENT_AMBULATORY_CARE_PROVIDER_SITE_OTHER): Payer: 59 | Admitting: Gastroenterology

## 2016-02-24 VITALS — BP 138/80 | HR 76 | Ht 70.5 in | Wt 224.2 lb

## 2016-02-24 DIAGNOSIS — R1011 Right upper quadrant pain: Secondary | ICD-10-CM | POA: Diagnosis not present

## 2016-02-24 NOTE — Patient Instructions (Signed)
Take Nexium over the counter daily x 2 weeks.  Call back if you still are having problems and want to schedule Upper Endoscopy.   Normal BMI (Body Mass Index- based on height and weight) is between 19 and 25. Your BMI today is Body mass index is 31.72 kg/m. Marland Kitchen Please consider follow up  regarding your BMI with your Primary Care Provider.  Thank you for choosing me and [facility].  Pricilla Riffle. Dagoberto Ligas., MD., Marval Regal

## 2016-02-24 NOTE — Progress Notes (Signed)
    History of Present Illness: This is a 55 year old male self referred for the evaluation of right upper quadrant pain, epigastric pain, dyspepsia and nausea. Recent abdominal ultrasound showed cholelithiasis without other abnormalities and he was referred by Dr. Redmond School to Dr. Harlow Asa for evaluation. The patient reports that Dr. Harlow Asa felt his symptoms were likely primarily biliary and advised cholecystectomy. The patient had concerns that some of his symptoms may not be related to his gallbladder. He describes frequent problems with mild epigastric discomfort sometimes associated with nausea that haven't improved with the when necessary use of Zantac and Gas-X. He also describes more severe, episodic right upper quadrant pain that has happened several times and these symptoms subside within a few hours. He underwent screening colonoscopy in June 2017 showing 1 adenomatous colon polyp and internal hemorrhoids. Denies weight loss, constipation, diarrhea, change in stool caliber, melena, hematochezia, vomiting, dysphagia, chest pain.  Current Medications, Allergies, Past Medical History, Past Surgical History, Family History and Social History were reviewed in [employer] record.  Physical Exam: General: Well developed, well nourished, no acute distress Head: Normocephalic and atraumatic Eyes:  sclerae anicteric, EOMI Ears: Normal auditory acuity Mouth: No deformity or lesions Lungs: Clear throughout to auscultation Heart: Regular rate and rhythm; no murmurs, rubs or bruits Abdomen: Soft, mild epigastric tenderness and non distended. No masses, hepatosplenomegaly or hernias noted. Normal Bowel sounds Musculoskeletal: Symmetrical with no gross deformities  Pulses:  Normal pulses noted Extremities: No clubbing, cyanosis, edema or deformities noted Neurological: Alert oriented x 4, grossly nonfocal Psychological:  Alert and cooperative. Normal mood and affect  Assessment  and Recommendations:  1. Suspected symptomatic cholelithiasis. Recommend proceeding with cholecystectomy. F/U with Dr. Harlow Asa.  2. Frequent, mild epigastric pain associated with nausea. Rule out GERD, gastritis, ulcer. Offered option of upper endoscopy or an empiric trial of PPIs. Patient opts for a PPI trial. Begin Nexium 20 mg daily for [redacted] weeks along with standard antireflux measures. If symptoms have not resolved recommend EGD.   3. Personal history of adenomatous colon polyps. Five-year interval surveillance colonoscopy recommended in June 2022.

## 2016-02-29 ENCOUNTER — Other Ambulatory Visit: Payer: Self-pay | Admitting: Family Medicine

## 2016-05-10 ENCOUNTER — Other Ambulatory Visit: Payer: Self-pay | Admitting: Family Medicine

## 2016-05-11 NOTE — Telephone Encounter (Signed)
Renew but he needs Coumadin for blood work

## 2016-05-11 NOTE — Telephone Encounter (Signed)
Is this okay to refill? 

## 2016-05-16 ENCOUNTER — Encounter: Payer: Self-pay | Admitting: Family Medicine

## 2016-05-23 ENCOUNTER — Other Ambulatory Visit: Payer: Self-pay | Admitting: Family Medicine

## 2016-05-28 ENCOUNTER — Telehealth: Payer: Self-pay | Admitting: Family Medicine

## 2016-05-28 NOTE — Telephone Encounter (Signed)
P.A. TESTOSTERONE gel 1%

## 2016-05-31 NOTE — Telephone Encounter (Signed)
P.A. Approved til 11/26/16, pt informed

## 2016-06-01 ENCOUNTER — Other Ambulatory Visit: Payer: Self-pay | Admitting: Family Medicine

## 2016-06-14 ENCOUNTER — Ambulatory Visit (INDEPENDENT_AMBULATORY_CARE_PROVIDER_SITE_OTHER): Payer: 59 | Admitting: Family Medicine

## 2016-06-14 ENCOUNTER — Encounter: Payer: Self-pay | Admitting: Family Medicine

## 2016-06-14 VITALS — BP 140/90 | HR 78 | Ht 71.0 in | Wt 228.0 lb

## 2016-06-14 DIAGNOSIS — E291 Testicular hypofunction: Secondary | ICD-10-CM

## 2016-06-14 DIAGNOSIS — I10 Essential (primary) hypertension: Secondary | ICD-10-CM

## 2016-06-14 DIAGNOSIS — Z23 Encounter for immunization: Secondary | ICD-10-CM

## 2016-06-14 MED ORDER — VERAPAMIL HCL ER 360 MG PO CP24: 360.0000 mg | ORAL_CAPSULE | Freq: Every day | ORAL | 2 refills | Status: DC

## 2016-06-14 NOTE — Progress Notes (Signed)
   Subjective:    Patient ID: Jason Robbins, male    DOB: Jun 24, 1961, 55 y.o.   MRN: [id number]  HPI He is here for a med check appointment. He has not been using his testosterone in the form of Testim due to the smell. He is recently given a generic brand product and plans to start that. He continues on his fenofibrate and is having no trouble with that. Continues on his blood pressure medications.   Review of Systems     Objective:   Physical Exam Alert and in no distress. Blood pressure is recorded. Review of his blood pressure readings over the last several years indicates the need to increase the meds based on new lower criteria for blood pressure.       Assessment & Plan:  Hypogonadism in male  Need for prophylactic vaccination and inoculation against influenza - Plan: Flu Vaccine QUAD 36+ mos PF IM (Fluarix & Fluzone Quad PF)  Essential hypertension - Plan: verapamil (VERELAN PM) 360 MG 24 hr capsule  He'll increase his verapamil and continue him on his other blood pressure medication and recheck here in one month. Also he will return for a recheck on his testosterone and other blood work in one month.

## 2016-07-08 ENCOUNTER — Ambulatory Visit: Payer: 59 | Admitting: Medical

## 2016-07-27 ENCOUNTER — Ambulatory Visit: Payer: 59 | Admitting: Family Medicine

## 2016-08-02 ENCOUNTER — Other Ambulatory Visit: Payer: Self-pay | Admitting: Family Medicine

## 2016-08-02 DIAGNOSIS — I10 Essential (primary) hypertension: Secondary | ICD-10-CM

## 2016-08-02 NOTE — Telephone Encounter (Signed)
Okay to wait  

## 2016-08-02 NOTE — Telephone Encounter (Signed)
Pt states he has not taken the 360 mg of Verapamil, he knows you increased medication last time from 240mg . Pt states he has lost weight and has only taken the 240mg . Is it ok to wait until 08/13/2015 to see if increase is needed in Verapamil. Victorino December

## 2016-08-03 NOTE — Telephone Encounter (Signed)
Make sure he does not run out before his appointment

## 2016-08-12 ENCOUNTER — Encounter: Payer: Self-pay | Admitting: Family Medicine

## 2016-08-12 ENCOUNTER — Ambulatory Visit (INDEPENDENT_AMBULATORY_CARE_PROVIDER_SITE_OTHER): Payer: 59 | Admitting: Family Medicine

## 2016-08-12 VITALS — BP 136/88 | HR 77 | Ht 71.0 in | Wt 229.0 lb

## 2016-08-12 DIAGNOSIS — I10 Essential (primary) hypertension: Secondary | ICD-10-CM

## 2016-08-12 DIAGNOSIS — E291 Testicular hypofunction: Secondary | ICD-10-CM | POA: Diagnosis not present

## 2016-08-12 NOTE — Patient Instructions (Signed)
20 minutes of something physical every day. In terms of losing weight look at your carbohydrates specifically "white foods" Put waist size as a goal rather than wait

## 2016-08-12 NOTE — Progress Notes (Signed)
   Subjective:    Patient ID: Jason Robbins, male    DOB: 09/29/1960, 56 y.o.   MRN: [id number]  HPI He is here for a recheck. He is now taking his testosterone regularly and is not sure whether he is seeing much benefit from it. He also did not start taking the higher dose of verapamil stating that he would like to see if he can get his blood pressure down in other ways and he is also concerned about erectile issues. He has cut down on his smoking but has not quit completely.   Review of Systems     Objective:   Physical Exam lurk and in no distress otherwise not examined. Blood pressure readings were evaluated with him.      Assessment & Plan:  Hypogonadism in male - Plan: Testosterone  Essential hypertension Follow-up after the testosterone results comes back as to whether to increase or not. Also discussed applying the testosterone to his shoulders, lateral chest and thigh area as options. Encouraged him to make diet and exercise changes and set a goal waist size rather than wait. I will recheck his blood pressure in several months.

## 2016-08-13 LAB — TESTOSTERONE: TESTOSTERONE: 524 ng/dL (ref 250–827)

## 2016-08-15 ENCOUNTER — Other Ambulatory Visit: Payer: Self-pay

## 2016-08-15 MED ORDER — TESTOSTERONE 50 MG/5GM (1%) TD GEL: TRANSDERMAL | 2 refills | Status: DC

## 2016-08-17 ENCOUNTER — Other Ambulatory Visit: Payer: Self-pay | Admitting: Family Medicine

## 2016-09-03 ENCOUNTER — Other Ambulatory Visit: Payer: Self-pay | Admitting: Family Medicine

## 2016-10-01 ENCOUNTER — Other Ambulatory Visit: Payer: Self-pay | Admitting: Family Medicine

## 2016-10-04 ENCOUNTER — Other Ambulatory Visit: Payer: Self-pay | Admitting: Family Medicine

## 2016-10-04 DIAGNOSIS — I10 Essential (primary) hypertension: Secondary | ICD-10-CM

## 2016-10-10 ENCOUNTER — Ambulatory Visit: Payer: 59 | Admitting: Family Medicine

## 2016-12-15 ENCOUNTER — Other Ambulatory Visit: Payer: Self-pay | Admitting: Family Medicine

## 2016-12-16 NOTE — Telephone Encounter (Signed)
Called in. /RLB  

## 2016-12-16 NOTE — Telephone Encounter (Signed)
Ok to renew?  

## 2016-12-16 NOTE — Telephone Encounter (Signed)
ok 

## 2016-12-24 ENCOUNTER — Telehealth: Payer: Self-pay | Admitting: Family Medicine

## 2016-12-24 NOTE — Telephone Encounter (Signed)
P.A. TESTOSTERONE °

## 2017-01-01 NOTE — Telephone Encounter (Signed)
P.A. Approved til 12/24/17, pt informed

## 2017-01-06 ENCOUNTER — Other Ambulatory Visit: Payer: Self-pay | Admitting: Family Medicine

## 2017-01-06 NOTE — Telephone Encounter (Signed)
Is this okay to refill med  

## 2017-04-01 ENCOUNTER — Other Ambulatory Visit: Payer: Self-pay | Admitting: Family Medicine

## 2017-04-03 NOTE — Telephone Encounter (Signed)
Pt is scheduled for end of October for a fasting med check to get cholesterol check and will get testosterone checked at that time too. Refilled med for 30 days only

## 2017-04-24 ENCOUNTER — Ambulatory Visit (INDEPENDENT_AMBULATORY_CARE_PROVIDER_SITE_OTHER): Payer: 59 | Admitting: Family Medicine

## 2017-04-24 ENCOUNTER — Encounter: Payer: Self-pay | Admitting: Family Medicine

## 2017-04-24 VITALS — BP 134/76 | HR 68 | Wt 234.0 lb

## 2017-04-24 DIAGNOSIS — Z1159 Encounter for screening for other viral diseases: Secondary | ICD-10-CM

## 2017-04-24 DIAGNOSIS — I10 Essential (primary) hypertension: Secondary | ICD-10-CM

## 2017-04-24 DIAGNOSIS — Z23 Encounter for immunization: Secondary | ICD-10-CM

## 2017-04-24 DIAGNOSIS — F17200 Nicotine dependence, unspecified, uncomplicated: Secondary | ICD-10-CM

## 2017-04-24 DIAGNOSIS — E785 Hyperlipidemia, unspecified: Secondary | ICD-10-CM

## 2017-04-24 DIAGNOSIS — L409 Psoriasis, unspecified: Secondary | ICD-10-CM

## 2017-04-24 DIAGNOSIS — Z86010 Personal history of colonic polyps: Secondary | ICD-10-CM | POA: Insufficient documentation

## 2017-04-24 DIAGNOSIS — E291 Testicular hypofunction: Secondary | ICD-10-CM

## 2017-04-24 DIAGNOSIS — J301 Allergic rhinitis due to pollen: Secondary | ICD-10-CM

## 2017-04-24 MED ORDER — FENOFIBRATE 160 MG PO TABS: 160.0000 mg | ORAL_TABLET | Freq: Every day | ORAL | 3 refills | Status: DC

## 2017-04-24 MED ORDER — VERAPAMIL HCL ER 240 MG PO CP24: 240.0000 mg | ORAL_CAPSULE | Freq: Every day | ORAL | 3 refills | Status: DC

## 2017-04-24 MED ORDER — LISINOPRIL-HYDROCHLOROTHIAZIDE 20-12.5 MG PO TABS: 1.0000 | ORAL_TABLET | Freq: Every day | ORAL | 3 refills | Status: DC

## 2017-04-24 NOTE — Progress Notes (Signed)
   Subjective:    Patient ID: Jason Robbins, male    DOB: [birth year], 56 y.o.   MRN: [id number]  HPI He is here for an interval evaluation. He has stopped taking his testosterone and cannot really see any difference in his symptom complex. He continues to smoke and is that this point not interested in quitting. His allergies seem to be under control with his present medication regimen. He does have reflux disease and does have a previous history of gallstones. He does use Zantac on an as-needed basis for this. Continues on verapamil for his hypertension. He does have psoriasis and does have occasional itching and does use steroid creams for this. Does have a previous history of colonic polyps and is scheduled for appropriate follow-up.   Review of Systems     Objective:   Physical Exam Alert and in no distress. Tympanic membranes and canals are normal. Pharyngeal area is normal. Neck is supple without adenopathy or thyromegaly. Cardiac exam shows a regular sinus rhythm without murmurs or gallops. Lungs are clear to auscultation. Abdominal exam shows no hepatosplenomegaly masses or tenderness. Hyperpigmented area noted on both flanks approximate 2-3 cm in size.        Assessment & Plan:  Essential hypertension - Plan: CBC with Differential/Platelet, Comprehensive metabolic panel, verapamil (VERELAN PM) 240 MG 24 hr capsule, lisinopril-hydrochlorothiazide (PRINZIDE,ZESTORETIC) 20-12.5 MG tablet  Need for influenza vaccination - Plan: Flu Vaccine QUAD 36+ mos IM  Psoriasis  Hypogonadism in male - Plan: PSA, Testosterone  Hyperlipidemia with target LDL less than 130 - Plan: Lipid panel, fenofibrate 160 MG tablet  Current smoker  Allergic rhinitis due to pollen, unspecified seasonality  History of colonic polyps  Need for hepatitis C screening test - Plan: Hepatitis C antibody  He will continue on his present medication regimen. Flu shot given. Recommend steroid cream for the  psoriatic lesions. We'll follow-up on testosterone based on the readings. He is not ready to quit smoking so I did not address that.

## 2017-04-24 NOTE — Patient Instructions (Signed)
20 minutes of something physcal every day Cut back on white food

## 2017-04-25 LAB — COMPREHENSIVE METABOLIC PANEL
AG Ratio: 1.8 (calc) (ref 1.0–2.5)
ALBUMIN MSPROF: 4.4 g/dL (ref 3.6–5.1)
ALKALINE PHOSPHATASE (APISO): 37 U/L — AB (ref 40–115)
ALT: 17 U/L (ref 9–46)
AST: 14 U/L (ref 10–35)
BUN: 14 mg/dL (ref 7–25)
CHLORIDE: 103 mmol/L (ref 98–110)
CO2: 26 mmol/L (ref 20–32)
CREATININE: 1.18 mg/dL (ref 0.70–1.33)
Calcium: 9.6 mg/dL (ref 8.6–10.3)
GLOBULIN: 2.4 g/dL (ref 1.9–3.7)
GLUCOSE: 91 mg/dL (ref 65–99)
POTASSIUM: 4 mmol/L (ref 3.5–5.3)
Sodium: 140 mmol/L (ref 135–146)
Total Bilirubin: 0.5 mg/dL (ref 0.2–1.2)
Total Protein: 6.8 g/dL (ref 6.1–8.1)

## 2017-04-25 LAB — CBC WITH DIFFERENTIAL/PLATELET
BASOS ABS: 51 {cells}/uL (ref 0–200)
Basophils Relative: 1.1 %
EOS PCT: 8.4 %
Eosinophils Absolute: 386 cells/uL (ref 15–500)
HCT: 38.3 % — ABNORMAL LOW (ref 38.5–50.0)
Hemoglobin: 13.4 g/dL (ref 13.2–17.1)
Lymphs Abs: 2466 cells/uL (ref 850–3900)
MCH: 32.9 pg (ref 27.0–33.0)
MCHC: 35 g/dL (ref 32.0–36.0)
MCV: 94.1 fL (ref 80.0–100.0)
MPV: 11.8 fL (ref 7.5–12.5)
Monocytes Relative: 13.4 %
NEUTROS PCT: 23.5 %
Neutro Abs: 1081 cells/uL — ABNORMAL LOW (ref 1500–7800)
PLATELETS: 261 10*3/uL (ref 140–400)
RBC: 4.07 10*6/uL — AB (ref 4.20–5.80)
RDW: 12 % (ref 11.0–15.0)
TOTAL LYMPHOCYTE: 53.6 %
WBC mixed population: 616 cells/uL (ref 200–950)
WBC: 4.6 10*3/uL (ref 3.8–10.8)

## 2017-04-25 LAB — LIPID PANEL
CHOL/HDL RATIO: 4.6 (calc) (ref <5.0)
CHOLESTEROL: 175 mg/dL (ref <200)
HDL: 38 mg/dL — AB (ref >40)
LDL CHOLESTEROL (CALC): 120 mg/dL — AB
NON-HDL CHOLESTEROL (CALC): 137 mg/dL — AB (ref <130)
Triglycerides: 78 mg/dL (ref <150)

## 2017-04-25 LAB — PSA: PSA: 0.6 ng/mL (ref <=4.0)

## 2017-04-25 LAB — HEPATITIS C ANTIBODY
HEP C AB: NONREACTIVE
SIGNAL TO CUT-OFF: 0.01 (ref <1.00)

## 2017-04-25 LAB — TESTOSTERONE: Testosterone: 459 ng/dL (ref 250–827)

## 2017-07-06 ENCOUNTER — Ambulatory Visit: Payer: 59 | Admitting: Family Medicine

## 2017-07-14 ENCOUNTER — Ambulatory Visit (INDEPENDENT_AMBULATORY_CARE_PROVIDER_SITE_OTHER): Payer: 59 | Admitting: Family Medicine

## 2017-07-14 ENCOUNTER — Encounter: Payer: Self-pay | Admitting: Family Medicine

## 2017-07-14 ENCOUNTER — Ambulatory Visit
Admission: RE | Admit: 2017-07-14 | Discharge: 2017-07-14 | Disposition: A | Discharge status: Home / Self Care | Payer: 59 | Source: Ambulatory Visit | Attending: Family Medicine | Admitting: Family Medicine

## 2017-07-14 VITALS — BP 148/90 | HR 82 | Wt 237.4 lb

## 2017-07-14 DIAGNOSIS — L409 Psoriasis, unspecified: Secondary | ICD-10-CM | POA: Diagnosis not present

## 2017-07-14 DIAGNOSIS — M2540 Effusion, unspecified joint: Secondary | ICD-10-CM | POA: Diagnosis not present

## 2017-07-14 NOTE — Addendum Note (Signed)
Addended by: Tyrone Apple on: 07/14/2017 09:17 AM   Modules accepted: Orders

## 2017-07-14 NOTE — Progress Notes (Signed)
   Subjective:    Patient ID: Jason Robbins, male    DOB: Jul 10, 1960, 57 y.o.   MRN: [id number]  HPI He is here for evaluation of swelling and discomfort that he noticed approximately 6 months ago started in his right index finger.  He had difficulty flexing his hand.  No history of injury to that.  Since then he is noted more joints involved on the right and now also in the left hand.  It is now interfering with his ADLs ; he is having using his computer mouse and also with buttoning and unbuttoning shirts as well as using a zipper.  No fever, chills, rash, weight change. Review of Systems     Objective:   Physical Exam Both hands does show an effusion in the second and third MCP joints.  Fourth and fifth fingers appear normal.  No effusions noted in the wrist, elbows, knees or ankles.  No skin changes were noted.       Assessment & Plan:  Psoriasis - Plan: Rheumatoid Arthritis Diagnostic Panel, Comprehensive, DG HANDS 1 VIEW BILAT BALLCATCHERS, ANA Comprehensive Panel  Joint effusion of multiple sites - Plan: Rheumatoid Arthritis Diagnostic Panel, Comprehensive, DG HANDS 1 VIEW BILAT BALLCATCHERS, ANA Comprehensive Panel Recommend that he use 2 Aleve twice per day to help with the pain and swelling.  Probable rheumatology referral pending results of blood and x-rays

## 2017-07-14 NOTE — Addendum Note (Signed)
Addended by: Tyrone Apple on: 07/14/2017 09:20 AM   Modules accepted: Orders

## 2017-07-15 LAB — ANA COMPREHENSIVE PANEL
Chromatin Ab SerPl-aCnc: <0.2 AI (ref 0.0–0.9)
ENA RNP Ab: 0.2 AI (ref 0.0–0.9)
ENA SM Ab Ser-aCnc: <0.2 AI (ref 0.0–0.9)
ENA SSA (RO) Ab: <0.2 AI (ref 0.0–0.9)
ENA SSB (LA) Ab: 1.5 AI — ABNORMAL HIGH (ref 0.0–0.9)
Scleroderma SCL-70: <0.2 AI (ref 0.0–0.9)

## 2017-07-15 LAB — RA EXPANDED PROFILE
CRP: 1.6 mg/L (ref 0.0–4.9)
CYCLIC CITRULLIN PEPTIDE AB: 6 U (ref 0–19)
Rhuematoid fact SerPl-aCnc: <10 IU/mL (ref 0.0–13.9)
Sed Rate: 11 mm/hr (ref 0–30)

## 2017-12-20 ENCOUNTER — Other Ambulatory Visit: Payer: BLUE CROSS/BLUE SHIELD

## 2017-12-20 NOTE — Progress Notes (Signed)
Pt was advised to come back in a month. Bring in b/p machine  156/86 or the fist reading and second reading is 148/86 . Per dr Redmond School.

## 2018-01-16 ENCOUNTER — Encounter: Payer: Self-pay | Admitting: Family Medicine

## 2018-03-30 ENCOUNTER — Other Ambulatory Visit: Payer: Self-pay | Admitting: Family Medicine

## 2018-03-30 ENCOUNTER — Telehealth: Payer: Self-pay

## 2018-03-30 DIAGNOSIS — I10 Essential (primary) hypertension: Secondary | ICD-10-CM

## 2018-03-30 DIAGNOSIS — E785 Hyperlipidemia, unspecified: Secondary | ICD-10-CM

## 2018-03-30 NOTE — Telephone Encounter (Signed)
Called pt to advise of med check appt needed. Jason Robbins

## 2018-07-02 ENCOUNTER — Other Ambulatory Visit: Payer: Self-pay | Admitting: Family Medicine

## 2018-07-02 DIAGNOSIS — I10 Essential (primary) hypertension: Secondary | ICD-10-CM

## 2018-07-03 ENCOUNTER — Other Ambulatory Visit: Payer: Self-pay | Admitting: Family Medicine

## 2018-07-03 DIAGNOSIS — E785 Hyperlipidemia, unspecified: Secondary | ICD-10-CM

## 2018-07-03 DIAGNOSIS — I10 Essential (primary) hypertension: Secondary | ICD-10-CM

## 2018-07-03 NOTE — Telephone Encounter (Signed)
Is this refill request appropriate?

## 2018-07-03 NOTE — Telephone Encounter (Signed)
He needs a med check appointment. 

## 2018-07-05 NOTE — Telephone Encounter (Signed)
Patient has scheduled med check for 09/13/2018

## 2018-07-17 ENCOUNTER — Telehealth: Payer: Self-pay

## 2018-07-17 DIAGNOSIS — Z125 Encounter for screening for malignant neoplasm of prostate: Secondary | ICD-10-CM

## 2018-07-17 DIAGNOSIS — Z0000 Encounter for general adult medical examination without abnormal findings: Secondary | ICD-10-CM

## 2018-07-17 DIAGNOSIS — I10 Essential (primary) hypertension: Secondary | ICD-10-CM

## 2018-07-17 DIAGNOSIS — E291 Testicular hypofunction: Secondary | ICD-10-CM

## 2018-07-17 DIAGNOSIS — E785 Hyperlipidemia, unspecified: Secondary | ICD-10-CM

## 2018-07-17 NOTE — Telephone Encounter (Signed)
Pt is asking to come in early and have labs done for his med check appt next week so labs can be back before appt. He is also requesting to have testosterone check as well . Pt will be in tomorrow for labs. Thanks [employer]

## 2018-07-17 NOTE — Telephone Encounter (Signed)
done

## 2018-07-18 ENCOUNTER — Other Ambulatory Visit: Payer: BLUE CROSS/BLUE SHIELD

## 2018-07-18 DIAGNOSIS — Z125 Encounter for screening for malignant neoplasm of prostate: Secondary | ICD-10-CM

## 2018-07-18 DIAGNOSIS — E291 Testicular hypofunction: Secondary | ICD-10-CM

## 2018-07-18 DIAGNOSIS — Z0000 Encounter for general adult medical examination without abnormal findings: Secondary | ICD-10-CM

## 2018-07-18 DIAGNOSIS — E785 Hyperlipidemia, unspecified: Secondary | ICD-10-CM

## 2018-07-19 LAB — COMPREHENSIVE METABOLIC PANEL
ALT: 21 IU/L (ref 0–44)
AST: 12 IU/L (ref 0–40)
Albumin/Globulin Ratio: 1.8 (ref 1.2–2.2)
Albumin: 4.4 g/dL (ref 3.8–4.9)
Alkaline Phosphatase: 42 IU/L (ref 39–117)
BUN/Creatinine Ratio: 13 (ref 9–20)
BUN: 15 mg/dL (ref 6–24)
Bilirubin Total: 0.3 mg/dL (ref 0.0–1.2)
CO2: 26 mmol/L (ref 20–29)
Calcium: 9.6 mg/dL (ref 8.7–10.2)
Chloride: 102 mmol/L (ref 96–106)
Creatinine, Ser: 1.18 mg/dL (ref 0.76–1.27)
GFR calc Af Amer: 79 mL/min/{1.73_m2} (ref >59)
GFR calc non Af Amer: 68 mL/min/{1.73_m2} (ref >59)
Globulin, Total: 2.5 g/dL (ref 1.5–4.5)
Glucose: 89 mg/dL (ref 65–99)
Potassium: 4.3 mmol/L (ref 3.5–5.2)
Sodium: 143 mmol/L (ref 134–144)
Total Protein: 6.9 g/dL (ref 6.0–8.5)

## 2018-07-19 LAB — CBC WITH DIFFERENTIAL/PLATELET
Basophils Absolute: 0 10*3/uL (ref 0.0–0.2)
Basos: 1 %
EOS (ABSOLUTE): 0.4 10*3/uL (ref 0.0–0.4)
Eos: 8 %
Hematocrit: 38.3 % (ref 37.5–51.0)
Hemoglobin: 13 g/dL (ref 13.0–17.7)
Immature Grans (Abs): 0 10*3/uL (ref 0.0–0.1)
Immature Granulocytes: 0 %
Lymphocytes Absolute: 2.3 10*3/uL (ref 0.7–3.1)
Lymphs: 50 %
MCH: 32.7 pg (ref 26.6–33.0)
MCHC: 33.9 g/dL (ref 31.5–35.7)
MCV: 96 fL (ref 79–97)
Monocytes Absolute: 0.7 10*3/uL (ref 0.1–0.9)
Monocytes: 14 %
Neutrophils Absolute: 1.2 10*3/uL — ABNORMAL LOW (ref 1.4–7.0)
Neutrophils: 27 %
Platelets: 254 10*3/uL (ref 150–450)
RBC: 3.98 x10E6/uL — ABNORMAL LOW (ref 4.14–5.80)
RDW: 12.5 % (ref 11.6–15.4)
WBC: 4.6 10*3/uL (ref 3.4–10.8)

## 2018-07-19 LAB — LIPID PANEL
Chol/HDL Ratio: 4.7 ratio (ref 0.0–5.0)
Cholesterol, Total: 182 mg/dL (ref 100–199)
HDL: 39 mg/dL — AB (ref >39)
LDL Calculated: 125 mg/dL — ABNORMAL HIGH (ref 0–99)
Triglycerides: 89 mg/dL (ref 0–149)
VLDL Cholesterol Cal: 18 mg/dL (ref 5–40)

## 2018-07-19 LAB — TESTOSTERONE: Testosterone: 381 ng/dL (ref 264–916)

## 2018-07-19 LAB — PSA: PROSTATE SPECIFIC AG, SERUM: 0.7 ng/mL (ref 0.0–4.0)

## 2018-07-25 ENCOUNTER — Ambulatory Visit: Payer: BLUE CROSS/BLUE SHIELD | Admitting: Family Medicine

## 2018-07-25 ENCOUNTER — Encounter: Payer: Self-pay | Admitting: Family Medicine

## 2018-07-25 VITALS — BP 132/88 | HR 76 | Temp 98.5°F | Wt 240.8 lb

## 2018-07-25 DIAGNOSIS — E291 Testicular hypofunction: Secondary | ICD-10-CM

## 2018-07-25 DIAGNOSIS — I10 Essential (primary) hypertension: Secondary | ICD-10-CM

## 2018-07-25 DIAGNOSIS — L409 Psoriasis, unspecified: Secondary | ICD-10-CM

## 2018-07-25 DIAGNOSIS — E785 Hyperlipidemia, unspecified: Secondary | ICD-10-CM

## 2018-07-25 MED ORDER — LISINOPRIL-HYDROCHLOROTHIAZIDE 20-12.5 MG PO TABS: 1.0000 | ORAL_TABLET | Freq: Every day | ORAL | 3 refills | Status: DC

## 2018-07-25 MED ORDER — VERAPAMIL HCL ER 240 MG PO CP24: 240.0000 mg | ORAL_CAPSULE | Freq: Every day | ORAL | 0 refills | Status: DC

## 2018-07-25 MED ORDER — FENOFIBRATE 160 MG PO TABS: 160.0000 mg | ORAL_TABLET | Freq: Every day | ORAL | 3 refills | Status: DC

## 2018-07-25 NOTE — Progress Notes (Signed)
   Subjective:    Patient ID: Jason Robbins, male    DOB: 05/23/1961, 58 y.o.   MRN: [id number]  HPI He is here for a med check appointment.  He does have underlying allergies.  He also has a previous history of hypogonadism.  Presently he is taking fenofibrate for his triglycerides and lisinopril/HCTZ and verapamil for his blood pressure.  His psoriasis has given him very little difficulty. 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  He seems to be enjoying this. Review of Systems     Objective:   Physical Exam Alert and in no distress. Tympanic membranes and canals are normal. Pharyngeal area is normal. Neck is supple without adenopathy or thyromegaly. Cardiac exam shows a regular sinus rhythm without murmurs or gallops. Lungs are clear to auscultation.  Blood work was reviewed and does show no need for testosterone at this time.      Assessment & Plan:  Hyperlipidemia with target LDL less than 130 - Plan: fenofibrate 160 MG tablet  Essential hypertension - Plan: lisinopril-hydrochlorothiazide (PRINZIDE,ZESTORETIC) 20-12.5 MG tablet, verapamil (VERELAN PM) 240 MG 24 hr capsule  Hypogonadism in male  Psoriasis He will continue on his present medications.  He is to set up for a complete exam in the next several months.

## 2018-09-12 ENCOUNTER — Encounter: Payer: Self-pay | Admitting: Family Medicine

## 2018-09-13 ENCOUNTER — Encounter: Payer: BLUE CROSS/BLUE SHIELD | Admitting: Family Medicine

## 2018-09-14 ENCOUNTER — Encounter: Payer: BLUE CROSS/BLUE SHIELD | Admitting: Family Medicine

## 2018-12-25 ENCOUNTER — Encounter: Payer: BLUE CROSS/BLUE SHIELD | Admitting: Family Medicine

## 2019-01-04 ENCOUNTER — Other Ambulatory Visit: Payer: Self-pay | Admitting: Family Medicine

## 2019-01-04 DIAGNOSIS — I10 Essential (primary) hypertension: Secondary | ICD-10-CM

## 2019-01-07 ENCOUNTER — Telehealth: Payer: Self-pay

## 2019-01-07 NOTE — Telephone Encounter (Signed)
lvm for pt to call office to complete covid screening and to be advised on check in process. Kh

## 2019-01-17 ENCOUNTER — Other Ambulatory Visit: Payer: Self-pay

## 2019-01-17 ENCOUNTER — Encounter: Payer: Self-pay | Admitting: Family Medicine

## 2019-01-17 ENCOUNTER — Ambulatory Visit (INDEPENDENT_AMBULATORY_CARE_PROVIDER_SITE_OTHER): Payer: BC Managed Care – PPO | Admitting: Family Medicine

## 2019-01-17 VITALS — BP 144/92 | HR 73 | Temp 98.4°F | Ht 71.0 in | Wt 234.8 lb

## 2019-01-17 DIAGNOSIS — F17200 Nicotine dependence, unspecified, uncomplicated: Secondary | ICD-10-CM

## 2019-01-17 DIAGNOSIS — M1990 Unspecified osteoarthritis, unspecified site: Secondary | ICD-10-CM

## 2019-01-17 DIAGNOSIS — J301 Allergic rhinitis due to pollen: Secondary | ICD-10-CM

## 2019-01-17 DIAGNOSIS — I10 Essential (primary) hypertension: Secondary | ICD-10-CM

## 2019-01-17 DIAGNOSIS — E785 Hyperlipidemia, unspecified: Secondary | ICD-10-CM | POA: Diagnosis not present

## 2019-01-17 DIAGNOSIS — N529 Male erectile dysfunction, unspecified: Secondary | ICD-10-CM

## 2019-01-17 DIAGNOSIS — Z125 Encounter for screening for malignant neoplasm of prostate: Secondary | ICD-10-CM

## 2019-01-17 DIAGNOSIS — Z0000 Encounter for general adult medical examination without abnormal findings: Secondary | ICD-10-CM | POA: Diagnosis not present

## 2019-01-17 DIAGNOSIS — L409 Psoriasis, unspecified: Secondary | ICD-10-CM

## 2019-01-17 DIAGNOSIS — E291 Testicular hypofunction: Secondary | ICD-10-CM

## 2019-01-17 MED ORDER — MELOXICAM 15 MG PO TABS: 15.0000 mg | ORAL_TABLET | Freq: Every day | ORAL | 1 refills | Status: DC

## 2019-01-17 MED ORDER — LISINOPRIL-HYDROCHLOROTHIAZIDE 20-12.5 MG PO TABS: 1.0000 | ORAL_TABLET | Freq: Every day | ORAL | 3 refills | Status: DC

## 2019-01-17 MED ORDER — FENOFIBRATE 160 MG PO TABS: 160.0000 mg | ORAL_TABLET | Freq: Every day | ORAL | 3 refills | Status: DC

## 2019-01-17 MED ORDER — VERAPAMIL HCL ER 240 MG PO CP24: 240.0000 mg | ORAL_CAPSULE | Freq: Every day | ORAL | 3 refills | Status: DC

## 2019-01-17 MED ORDER — TADALAFIL 20 MG PO TABS: 20.0000 mg | ORAL_TABLET | Freq: Every day | ORAL | 1 refills | Status: DC | PRN

## 2019-01-17 NOTE — Progress Notes (Signed)
   Subjective:    Patient ID: Jason Robbins, male    DOB: 04-16-1961, 58 y.o.   MRN: [id number]  HPI He is here for complete examination.  He is still having some difficulty with arthritis type symptoms in his hands and also intermittently in other joints.  He finds the meloxicam that was given to him by rheumatology was quite useful.  He would like to continue with that.  He also complains of intermittent weakness but no muscle aches or pains, fever, chills, cough or congestion.  Does have a previous history of difficulty with low testosterone but does seem to otherwise have strength as well as no trouble with libido.  He has had some difficulty with getting and maintaining erections and would like some help with that.  He continues to smoke and is not interested in quitting right now.  His allergies seem to be under good control.  He continues on his blood pressure medication as well as fenofibrate.  Does have a history of psoriasis but presently is not having difficulty with that.  His home life is going well.  Family and social history as well as health maintenance and immunizations was reviewed   Review of Systems  All other systems reviewed and are negative.      Objective:   Physical Exam Alert and in no distress. Tympanic membranes and canals are normal. Pharyngeal area is normal. Neck is supple without adenopathy or thyromegaly. Cardiac exam shows a regular sinus rhythm without murmurs or gallops. Lungs are clear to auscultation. Exam shows no masses or tenderness.  No joint swelling noted.       Assessment & Plan:  Routine general medical examination at a health care facility - Plan: CBC with Differential/Platelet, Comprehensive metabolic panel, Lipid panel,   Essential hypertension - Plan: lisinopril-hydrochlorothiazide (ZESTORETIC) 20-12.5 MG tablet, verapamil (VERELAN PM) 240 MG 24 hr capsule,   Screening for prostate cancer - Plan: PSA, CANCELED: PSA,   Current smoker - Plan:  He is not interested in quitting smoking at the present time.  Hyperlipidemia with target LDL less than 130 - Plan: Lipid panel, fenofibrate 160 MG tablet,   Hypogonadism in male - Plan: Testosterone, since he is having intermittent difficulty with weakness as well as some erectile issues, this is not unreasonable.  Allergic rhinitis due to pollen, unspecified seasonality - Plan: Continue present meds  Erectile dysfunction, unspecified erectile dysfunction type - Plan: tadalafil (CIALIS) 20 MG tablet, explained how to use the Cialis and possible side effects to him.  Arthritis - Plan: meloxicam (MOBIC) 15 MG tablet, he will use this as needed.  Psoriasis - Plan: No intervention needed at this time.

## 2019-01-18 ENCOUNTER — Encounter: Payer: Self-pay | Admitting: Family Medicine

## 2019-01-18 LAB — COMPREHENSIVE METABOLIC PANEL
ALT: 17 IU/L (ref 0–44)
AST: 16 IU/L (ref 0–40)
Albumin/Globulin Ratio: 1.9 (ref 1.2–2.2)
Albumin: 4.8 g/dL (ref 3.8–4.9)
Alkaline Phosphatase: 41 IU/L (ref 39–117)
BUN/Creatinine Ratio: 12 (ref 9–20)
BUN: 14 mg/dL (ref 6–24)
Bilirubin Total: 0.4 mg/dL (ref 0.0–1.2)
CO2: 23 mmol/L (ref 20–29)
Calcium: 9.9 mg/dL (ref 8.7–10.2)
Chloride: 101 mmol/L (ref 96–106)
Creatinine, Ser: 1.16 mg/dL (ref 0.76–1.27)
GFR calc Af Amer: 80 mL/min/{1.73_m2} (ref >59)
GFR calc non Af Amer: 69 mL/min/{1.73_m2} (ref >59)
Globulin, Total: 2.5 g/dL (ref 1.5–4.5)
Glucose: 87 mg/dL (ref 65–99)
Potassium: 4.5 mmol/L (ref 3.5–5.2)
Sodium: 139 mmol/L (ref 134–144)
Total Protein: 7.3 g/dL (ref 6.0–8.5)

## 2019-01-18 LAB — CBC WITH DIFFERENTIAL/PLATELET
Basophils Absolute: 0 10*3/uL (ref 0.0–0.2)
Basos: 1 %
EOS (ABSOLUTE): 0.4 10*3/uL (ref 0.0–0.4)
Eos: 9 %
Hematocrit: 40.5 % (ref 37.5–51.0)
Hemoglobin: 13.7 g/dL (ref 13.0–17.7)
Immature Grans (Abs): 0 10*3/uL (ref 0.0–0.1)
Immature Granulocytes: 0 %
Lymphocytes Absolute: 1.9 10*3/uL (ref 0.7–3.1)
Lymphs: 49 %
MCH: 32.2 pg (ref 26.6–33.0)
MCHC: 33.8 g/dL (ref 31.5–35.7)
MCV: 95 fL (ref 79–97)
Monocytes Absolute: 0.6 10*3/uL (ref 0.1–0.9)
Monocytes: 16 %
Neutrophils Absolute: 1 10*3/uL — ABNORMAL LOW (ref 1.4–7.0)
Neutrophils: 25 %
Platelets: 282 10*3/uL (ref 150–450)
RBC: 4.26 x10E6/uL (ref 4.14–5.80)
RDW: 12.5 % (ref 11.6–15.4)
WBC: 3.9 10*3/uL (ref 3.4–10.8)

## 2019-01-18 LAB — LIPID PANEL
Chol/HDL Ratio: 4.9 ratio (ref 0.0–5.0)
Cholesterol, Total: 188 mg/dL (ref 100–199)
HDL: 38 mg/dL — ABNORMAL LOW (ref >39)
LDL Calculated: 131 mg/dL — ABNORMAL HIGH (ref 0–99)
Triglycerides: 95 mg/dL (ref 0–149)
VLDL Cholesterol Cal: 19 mg/dL (ref 5–40)

## 2019-01-18 LAB — TESTOSTERONE: Testosterone: 424 ng/dL (ref 264–916)

## 2019-01-18 LAB — PSA: Prostate Specific Ag, Serum: 0.7 ng/mL (ref 0.0–4.0)

## 2019-01-31 ENCOUNTER — Encounter: Payer: Self-pay | Admitting: Family Medicine

## 2019-03-18 ENCOUNTER — Other Ambulatory Visit: Payer: Self-pay | Admitting: Family Medicine

## 2019-03-18 DIAGNOSIS — M1990 Unspecified osteoarthritis, unspecified site: Secondary | ICD-10-CM

## 2019-03-18 NOTE — Telephone Encounter (Signed)
[facility] is requesting to fill pt mobic please advise KH 

## 2019-06-15 ENCOUNTER — Other Ambulatory Visit: Payer: Self-pay | Admitting: Family Medicine

## 2019-06-15 DIAGNOSIS — M1990 Unspecified osteoarthritis, unspecified site: Secondary | ICD-10-CM

## 2019-06-17 NOTE — Telephone Encounter (Signed)
[facility] to [facility] meloxicam . Please advise KH 

## 2019-06-28 DIAGNOSIS — I639 Cerebral infarction, unspecified: Secondary | ICD-10-CM

## 2019-06-28 HISTORY — DX: Cerebral infarction, unspecified: I63.9

## 2019-09-06 ENCOUNTER — Ambulatory Visit: Payer: 59

## 2019-11-29 ENCOUNTER — Ambulatory Visit: Payer: BC Managed Care – PPO | Admitting: Family Medicine

## 2019-12-04 ENCOUNTER — Ambulatory Visit: Payer: BC Managed Care – PPO | Admitting: Family Medicine

## 2019-12-24 ENCOUNTER — Encounter: Payer: Self-pay | Admitting: Podiatry

## 2019-12-24 ENCOUNTER — Other Ambulatory Visit: Payer: Self-pay

## 2019-12-24 ENCOUNTER — Ambulatory Visit (INDEPENDENT_AMBULATORY_CARE_PROVIDER_SITE_OTHER): Payer: BC Managed Care – PPO

## 2019-12-24 ENCOUNTER — Ambulatory Visit (INDEPENDENT_AMBULATORY_CARE_PROVIDER_SITE_OTHER): Payer: BC Managed Care – PPO | Admitting: Podiatry

## 2019-12-24 DIAGNOSIS — L603 Nail dystrophy: Secondary | ICD-10-CM | POA: Diagnosis not present

## 2019-12-24 DIAGNOSIS — M722 Plantar fascial fibromatosis: Secondary | ICD-10-CM

## 2019-12-24 NOTE — Progress Notes (Signed)
  Subjective:  Patient ID: Jason Robbins, male    DOB: 1961-02-22,  MRN: [id number]  Chief Complaint  Patient presents with  . Foot Pain    Plantar heel right - aching x 1 month, AM pain, tried ice, stretching, Advil, OTC inserts (some help), trying to wear sneakers more instead of dress shoes  . New Patient (Initial Visit)    59 y.o. male presents with the above complaint.  He has been wearing Dr. Felicie Morn inserts for plantar fasciitis.  This has been helpful.  He also noted stretching.  He inquires about other orthotics if custom orthotics would be helpful for him.  He also states that he had damaged his left hallux nail many years ago, he saw Dr. Valentina Lucks who offered nail avulsion versus treatment for antifungal therapy.  Occasionally the thickness of the toenail is uncomfortable for him shoe gear.   Review of Systems: Negative except as noted in the HPI. Denies N/V/F/Ch.   Objective:  There were no vitals filed for this visit. There is no height or weight on file to calculate BMI. Constitutional Well developed. Well nourished.  Vascular Dorsalis pedis pulses palpable bilaterally. Posterior tibial pulses palpable bilaterally. Capillary refill normal to all digits.  No cyanosis or clubbing noted. Pedal hair growth normal.  Neurologic Normal speech. Oriented to person, place, and time. Epicritic sensation to light touch grossly present bilaterally.  Dermatologic Nails well groomed and normal in appearance.  Left hallux nail is dystrophic with subungual debris, no open wounds. No skin lesions.  Orthopedic: Normal joint ROM without pain or crepitus bilaterally. No visible deformities. Tender to palpation at the calcaneal tuber right. No pain with calcaneal squeeze right. Ankle ROM diminished range of motion right. Silfverskiold Test: negative right.   Radiographs: Taken and reviewed. No acute fractures or dislocations. No evidence of stress fracture.  Plantar heel spur present.  Posterior heel spur present.   Assessment:   1. Plantar fasciitis, right   2. Nail dystrophy    Plan:  Patient was evaluated and treated and all questions answered.  Plantar Fasciitis, right - XR reviewed as above.  - Educated on icing and stretching. Instructions given.  - Injection delivered to the plantar fascia as below. -Discussed prefabricated versus custom orthotics.  If he is interested in custom orthotics which might be helpful for him long-term and more robust than a prefabricated orthotic, he can return and see [name] for this.  Nail dystrophy -Fungal culture biopsy was taken of the left nail plate unit  Procedure: Injection Tendon/Ligament Location: Right plantar fascia at the glabrous junction; medial approach. Skin Prep: alcohol Injectate: 1 cc 0.5% marcaine plain, 20mg  kenalog Disposition: Patient tolerated procedure well. Injection site dressed with a band-aid.  Return in about 4 weeks (around 01/21/2020) for [name] Results, Schedule with [name], orthotics-ASAP.

## 2019-12-24 NOTE — Patient Instructions (Signed)

## 2019-12-25 ENCOUNTER — Encounter: Payer: Self-pay | Admitting: Podiatry

## 2019-12-25 ENCOUNTER — Telehealth: Payer: Self-pay | Admitting: Podiatry

## 2019-12-25 NOTE — Telephone Encounter (Signed)
Pt called stating that his pain is worse today up to a level of 10 from a 1 on 12/24/19 since injection for P/F pt would like to know if the pain is normal and how long will the pain and some mild swelling last please assist

## 2019-12-25 NOTE — Telephone Encounter (Signed)
Spoke with Mr Bentson, he's having increased pain, taking motrin which helps a little. Advised to ice it after work and take it easy and that he is likely experiencing a "steroid flare" reaction to the kenalog. Should resolve in 1-2 days. Reviewed SOI and currently has none, if these develop he will let me know.  Lanae Crumbly, DPM 12/25/2019

## 2020-01-13 ENCOUNTER — Telehealth: Payer: Self-pay | Admitting: Podiatry

## 2020-01-13 NOTE — Telephone Encounter (Signed)
I just called him and it was sent to voicemail. If he returns the call and I'm free (I'm shadowing with Dr Jacqualyn Posey this afternoon) I'd be happy to talk to him about his results but feel free to let him know that it was positive for fungus. We can treat either with oral terbinafine or topical Jublia, we discussed both briefly at his visit. If he'd prefer to discuss in person I'd be happy to see him this week as well.  Thanks! Lanae Crumbly, DPM 01/13/2020

## 2020-01-13 NOTE — Telephone Encounter (Signed)
Pt would like [name] resuts over the phone

## 2020-01-20 ENCOUNTER — Encounter: Payer: BC Managed Care – PPO | Admitting: Family Medicine

## 2020-01-21 ENCOUNTER — Encounter: Payer: Self-pay | Admitting: Family Medicine

## 2020-01-23 ENCOUNTER — Ambulatory Visit: Payer: BC Managed Care – PPO | Admitting: Podiatry

## 2020-01-27 ENCOUNTER — Ambulatory Visit (INDEPENDENT_AMBULATORY_CARE_PROVIDER_SITE_OTHER): Payer: BC Managed Care – PPO | Admitting: Orthotics

## 2020-01-27 ENCOUNTER — Other Ambulatory Visit: Payer: Self-pay

## 2020-01-27 DIAGNOSIS — L603 Nail dystrophy: Secondary | ICD-10-CM

## 2020-01-27 DIAGNOSIS — M722 Plantar fascial fibromatosis: Secondary | ICD-10-CM | POA: Diagnosis not present

## 2020-01-28 NOTE — Progress Notes (Signed)

## 2020-02-17 ENCOUNTER — Other Ambulatory Visit: Payer: BC Managed Care – PPO | Admitting: Orthotics

## 2020-02-21 ENCOUNTER — Other Ambulatory Visit: Payer: BC Managed Care – PPO | Admitting: Orthotics

## 2020-02-21 ENCOUNTER — Other Ambulatory Visit: Payer: Self-pay

## 2020-03-11 ENCOUNTER — Other Ambulatory Visit: Payer: Self-pay | Admitting: Family Medicine

## 2020-03-11 DIAGNOSIS — M1990 Unspecified osteoarthritis, unspecified site: Secondary | ICD-10-CM

## 2020-03-11 NOTE — Telephone Encounter (Signed)
[facility] to [facility] meloxicam . Please advise KH 

## 2020-03-30 ENCOUNTER — Telehealth: Payer: Self-pay

## 2020-03-30 ENCOUNTER — Other Ambulatory Visit: Payer: Self-pay | Admitting: Family Medicine

## 2020-03-30 DIAGNOSIS — E785 Hyperlipidemia, unspecified: Secondary | ICD-10-CM

## 2020-03-30 DIAGNOSIS — I10 Essential (primary) hypertension: Secondary | ICD-10-CM

## 2020-03-30 NOTE — Telephone Encounter (Signed)
LVM for pt to advise to schedule  a med check or cpe with dr. Redmond School. Kh

## 2020-06-07 ENCOUNTER — Other Ambulatory Visit: Payer: Self-pay | Admitting: Family Medicine

## 2020-06-07 DIAGNOSIS — M1990 Unspecified osteoarthritis, unspecified site: Secondary | ICD-10-CM

## 2020-06-08 NOTE — Telephone Encounter (Signed)
[facility] is requesting to fill pt meloxiam. Please advise [facility]

## 2020-06-28 ENCOUNTER — Other Ambulatory Visit: Payer: Self-pay | Admitting: Family Medicine

## 2020-06-28 DIAGNOSIS — I10 Essential (primary) hypertension: Secondary | ICD-10-CM

## 2020-06-28 DIAGNOSIS — E785 Hyperlipidemia, unspecified: Secondary | ICD-10-CM

## 2020-07-05 ENCOUNTER — Inpatient Hospital Stay (HOSPITAL_COMMUNITY): Payer: BC Managed Care – PPO

## 2020-07-05 ENCOUNTER — Observation Stay (HOSPITAL_COMMUNITY)
Admission: EM | Admit: 2020-07-05 | Discharge: 2020-07-06 | Disposition: A | Discharge status: Home / Self Care | Payer: BC Managed Care – PPO | Attending: Internal Medicine | Admitting: Internal Medicine

## 2020-07-05 ENCOUNTER — Emergency Department (HOSPITAL_COMMUNITY): Payer: BC Managed Care – PPO

## 2020-07-05 ENCOUNTER — Encounter (HOSPITAL_COMMUNITY): Payer: Self-pay | Admitting: Emergency Medicine

## 2020-07-05 ENCOUNTER — Other Ambulatory Visit: Payer: Self-pay

## 2020-07-05 DIAGNOSIS — I639 Cerebral infarction, unspecified: Secondary | ICD-10-CM | POA: Diagnosis not present

## 2020-07-05 DIAGNOSIS — I10 Essential (primary) hypertension: Secondary | ICD-10-CM | POA: Diagnosis not present

## 2020-07-05 DIAGNOSIS — Z720 Tobacco use: Secondary | ICD-10-CM | POA: Diagnosis not present

## 2020-07-05 DIAGNOSIS — E785 Hyperlipidemia, unspecified: Secondary | ICD-10-CM | POA: Insufficient documentation

## 2020-07-05 DIAGNOSIS — F17210 Nicotine dependence, cigarettes, uncomplicated: Secondary | ICD-10-CM | POA: Diagnosis not present

## 2020-07-05 DIAGNOSIS — Z20822 Contact with and (suspected) exposure to covid-19: Secondary | ICD-10-CM | POA: Insufficient documentation

## 2020-07-05 DIAGNOSIS — R200 Anesthesia of skin: Secondary | ICD-10-CM

## 2020-07-05 DIAGNOSIS — Z8673 Personal history of transient ischemic attack (TIA), and cerebral infarction without residual deficits: Secondary | ICD-10-CM | POA: Diagnosis present

## 2020-07-05 DIAGNOSIS — Z79899 Other long term (current) drug therapy: Secondary | ICD-10-CM | POA: Insufficient documentation

## 2020-07-05 LAB — CBC
HCT: 41.7 % (ref 39.0–52.0)
Hemoglobin: 13.8 g/dL (ref 13.0–17.0)
MCH: 31.9 pg (ref 26.0–34.0)
MCHC: 33.1 g/dL (ref 30.0–36.0)
MCV: 96.5 fL (ref 80.0–100.0)
Platelets: 291 10*3/uL (ref 150–400)
RBC: 4.32 MIL/uL (ref 4.22–5.81)
RDW: 12.8 % (ref 11.5–15.5)
WBC: 4.6 10*3/uL (ref 4.0–10.5)
nRBC: 0 % (ref 0.0–0.2)

## 2020-07-05 LAB — COMPREHENSIVE METABOLIC PANEL
ALT: 25 U/L (ref 0–44)
AST: 20 U/L (ref 15–41)
Albumin: 4.3 g/dL (ref 3.5–5.0)
Alkaline Phosphatase: 42 U/L (ref 38–126)
Anion gap: 12 (ref 5–15)
BUN: 13 mg/dL (ref 6–20)
CO2: 25 mmol/L (ref 22–32)
Calcium: 9.9 mg/dL (ref 8.9–10.3)
Chloride: 103 mmol/L (ref 98–111)
Creatinine, Ser: 1.12 mg/dL (ref 0.61–1.24)
GFR, Estimated: >60 mL/min (ref >60)
Glucose, Bld: 107 mg/dL — ABNORMAL HIGH (ref 70–99)
Potassium: 3.9 mmol/L (ref 3.5–5.1)
Sodium: 140 mmol/L (ref 135–145)
Total Bilirubin: 0.5 mg/dL (ref 0.3–1.2)
Total Protein: 7.5 g/dL (ref 6.5–8.1)

## 2020-07-05 LAB — DIFFERENTIAL
Abs Immature Granulocytes: 0 10*3/uL (ref 0.00–0.07)
Basophils Absolute: 0.1 10*3/uL (ref 0.0–0.1)
Basophils Relative: 1 %
Eosinophils Absolute: 0.2 10*3/uL (ref 0.0–0.5)
Eosinophils Relative: 4 %
Immature Granulocytes: 0 %
Lymphocytes Relative: 42 %
Lymphs Abs: 1.9 10*3/uL (ref 0.7–4.0)
Monocytes Absolute: 0.8 10*3/uL (ref 0.1–1.0)
Monocytes Relative: 16 %
Neutro Abs: 1.7 10*3/uL (ref 1.7–7.7)
Neutrophils Relative %: 37 %

## 2020-07-05 LAB — PROTIME-INR
INR: 1 (ref 0.8–1.2)
Prothrombin Time: 12.6 seconds (ref 11.4–15.2)

## 2020-07-05 LAB — MAGNESIUM: Magnesium: 1.9 mg/dL (ref 1.7–2.4)

## 2020-07-05 LAB — APTT: aPTT: 28 seconds (ref 24–36)

## 2020-07-05 MED ORDER — HYDROCHLOROTHIAZIDE 12.5 MG PO CAPS
12.5000 mg | ORAL_CAPSULE | Freq: Every day | ORAL | Status: DC
  Administered 2020-07-06: 12.5 mg via ORAL
  Filled 2020-07-05: qty 1

## 2020-07-05 MED ORDER — SENNOSIDES-DOCUSATE SODIUM 8.6-50 MG PO TABS: 1.0000 | ORAL_TABLET | Freq: Every evening | ORAL | Status: DC | PRN

## 2020-07-05 MED ORDER — NICOTINE 21 MG/24HR TD PT24
21.0000 mg | MEDICATED_PATCH | Freq: Every day | TRANSDERMAL | Status: DC
  Filled 2020-07-05: qty 1

## 2020-07-05 MED ORDER — SODIUM CHLORIDE 0.9 % IV SOLN: INTRAVENOUS | Status: DC

## 2020-07-05 MED ORDER — ATORVASTATIN CALCIUM 40 MG PO TABS
80.0000 mg | ORAL_TABLET | Freq: Every day | ORAL | Status: DC
  Administered 2020-07-05 – 2020-07-06 (×2): 80 mg via ORAL
  Filled 2020-07-05 (×2): qty 2

## 2020-07-05 MED ORDER — VERAPAMIL HCL ER 240 MG PO TBCR
240.0000 mg | EXTENDED_RELEASE_TABLET | Freq: Every day | ORAL | Status: DC
  Administered 2020-07-06: 240 mg via ORAL
  Filled 2020-07-05 (×2): qty 1

## 2020-07-05 MED ORDER — ASPIRIN 81 MG PO CHEW
81.0000 mg | CHEWABLE_TABLET | Freq: Every day | ORAL | Status: DC
  Administered 2020-07-05 – 2020-07-06 (×2): 81 mg via ORAL
  Filled 2020-07-05 (×2): qty 1

## 2020-07-05 MED ORDER — LISINOPRIL-HYDROCHLOROTHIAZIDE 20-12.5 MG PO TABS: 1.0000 | ORAL_TABLET | Freq: Every day | ORAL | Status: DC

## 2020-07-05 MED ORDER — STROKE: EARLY STAGES OF RECOVERY BOOK
Freq: Once | Status: AC
  Filled 2020-07-05: qty 1

## 2020-07-05 MED ORDER — LISINOPRIL 20 MG PO TABS
20.0000 mg | ORAL_TABLET | Freq: Every day | ORAL | Status: DC
  Administered 2020-07-06: 20 mg via ORAL
  Filled 2020-07-05: qty 1

## 2020-07-05 MED ORDER — GADOBUTROL 1 MMOL/ML IV SOLN
10.0000 mL | Freq: Once | INTRAVENOUS | Status: AC | PRN
  Administered 2020-07-05: 10 mL via INTRAVENOUS

## 2020-07-05 MED ORDER — ACETAMINOPHEN 325 MG PO TABS: 650.0000 mg | ORAL_TABLET | ORAL | Status: DC | PRN

## 2020-07-05 MED ORDER — IOHEXOL 350 MG/ML SOLN
75.0000 mL | Freq: Once | INTRAVENOUS | Status: AC | PRN
  Administered 2020-07-05: 75 mL via INTRAVENOUS

## 2020-07-05 MED ORDER — FENOFIBRATE 160 MG PO TABS
160.0000 mg | ORAL_TABLET | Freq: Every day | ORAL | Status: DC
Start: 2020-07-05 — End: 2020-07-06
  Administered 2020-07-06: 160 mg via ORAL
  Filled 2020-07-05: qty 1

## 2020-07-05 MED ORDER — SODIUM CHLORIDE 0.9% FLUSH
3.0000 mL | Freq: Once | INTRAVENOUS | Status: AC
  Administered 2020-07-05: 3 mL via INTRAVENOUS

## 2020-07-05 MED ORDER — LABETALOL HCL 5 MG/ML IV SOLN
10.0000 mg | Freq: Once | INTRAVENOUS | Status: AC
  Administered 2020-07-05: 10 mg via INTRAVENOUS
  Filled 2020-07-05: qty 4

## 2020-07-05 MED ORDER — ACETAMINOPHEN 650 MG RE SUPP: 650.0000 mg | RECTAL | Status: DC | PRN

## 2020-07-05 MED ORDER — ACETAMINOPHEN 160 MG/5ML PO SOLN: 650.0000 mg | ORAL | Status: DC | PRN

## 2020-07-05 MED ORDER — ASPIRIN 300 MG RE SUPP: 300.0000 mg | Freq: Every day | RECTAL | Status: DC

## 2020-07-05 MED ORDER — CLOPIDOGREL BISULFATE 75 MG PO TABS
75.0000 mg | ORAL_TABLET | Freq: Every day | ORAL | Status: DC
  Administered 2020-07-05 – 2020-07-06 (×2): 75 mg via ORAL
  Filled 2020-07-05 (×2): qty 1

## 2020-07-05 NOTE — ED Notes (Signed)
Patient transported to X-ray 

## 2020-07-05 NOTE — Consult Note (Incomplete)
NEUROLOGY CONSULTATION NOTE   Date of service: July 05, 2020 Patient Name: Jason Robbins MRN:  [id number] DOB:  [birth year] Reason for consult: "Stroke" _ _ _   _ __   _ __ _ _  __ __   _ __   __ _  History of Present Illness  Jason Robbins is a 60 y.o. male with PMH significant for HLd, GERD, smoker, tubular who presents with right-sided numbness and tingling that started about last night.  ***   ROS   Constitutional Denies weight loss, fever and chills. ***  HEENT Denies changes in vision and hearing. ***  Respiratory Denies SOB and cough. ***  CV Denies palpitations and CP ***  GI Denies abdominal pain, nausea, vomiting and diarrhea. ***  GU Denies dysuria and urinary frequency. ***  MSK Denies myalgia and joint pain. ***  Skin Denies rash and pruritus. ***  Neurological Denies headache and syncope. ***  Psychiatric Denies recent changes in mood. Denies anxiety and depression. ***   Past History   Past Medical History:  Diagnosis Date  . Allergy    RHINITIS  . Cholelithiasis   . Dyslipidemia   . GERD (gastroesophageal reflux disease)   . Herpes labialis   . Hypertension   . Psoriasis   . Renal stone   . Smoker   . Tubular adenoma of colon 12/21/2015   Past Surgical History:  Procedure Laterality Date  . KIDNEY STONE SURGERY    . NASAL SINUS SURGERY     Family History  Problem Relation Age of Onset  . Hypertension Mother   . Stroke Mother   . Cancer Mother        biliary duct cancer  . Hypertension Father   . Stroke Father   . Hypertension Sister   . Heart disease Sister   . Colon cancer Neg Hx    Social History   Socioeconomic History  . Marital status: Married    Spouse name: Not on file  . Number of children: Not on file  . Years of education: Not on file  . Highest education level: Not on file  Occupational History  . Not on file  Tobacco Use  . Smoking status: Current Every Day Smoker    Packs/day: 0.25    Types: Cigarettes  . Smokeless  tobacco: Never Used  . Tobacco comment: 1 pack monthly; uses e-cigs inbetween; trying to quit  Vaping Use  . Vaping Use: Never used  Substance and Sexual Activity  . Alcohol use: Yes    Alcohol/week: 0.0 standard drinks    Comment: holidays  . Drug use: No  . Sexual activity: Yes  Other Topics Concern  . Not on file  Social History Narrative  . Not on file   Social Determinants of Health   Financial Resource Strain: Not on file  Food Insecurity: Not on file  Transportation Needs: Not on file  Physical Activity: Not on file  Stress: Not on file  Social Connections: Not on file   Allergies  Allergen Reactions  . Sulfa Antibiotics Swelling    Medications  (Not in a hospital admission)    Vitals   Vitals:   07/05/20 1144 07/05/20 1523 07/05/20 1620  BP: (!) 185/115 (!) 188/98 (!) 195/99  Pulse: 100 71 76  Resp: 18 16 19   Temp: 98.6 F (37 C) 98.2 F (36.8 C)   TempSrc: Oral Oral   SpO2: 100% 97% 99%  Weight: 106.6 kg  Height: 5\' 11"  (1.803 m)       Body mass index is 32.78 kg/m.  Physical Exam   General: Laying comfortably in bed; in no acute distress. *** HENT: Normal oropharynx and mucosa. Normal external appearance of ears and nose. *** Neck: Supple, no pain or tenderness *** CV: No JVD. No peripheral edema. *** Pulmonary: Symmetric Chest rise. Normal respiratory effort. *** Abdomen: Soft to touch, non-tender. *** Ext: No cyanosis, edema, or deformity *** Skin: No rash. Normal palpation of skin.  *** Musculoskeletal: Normal digits and nails by inspection. No clubbing. ***  Neurologic Examination  Mental status/Cognition: Alert, oriented to self, place, month and year, good attention. *** Speech/language: Fluent, comprehension intact, object naming intact, repetition intact. *** Cranial nerves:   CN II Pupils equal and reactive to light, no VF deficits ***   CN III,IV,VI EOM intact, no gaze preference or deviation, no nystagmus ***   CN V normal  sensation in V1, V2, and V3 segments bilaterally ***   CN VII no asymmetry, no nasolabial fold flattening ***   CN VIII normal hearing to speech ***   CN IX & X normal palatal elevation, no uvular deviation ***   CN XI 5/5 head turn and 5/5 shoulder shrug bilaterally ***   CN XII midline tongue protrusion ***   Motor:  Muscle bulk: ***, tone ***, pronator drift *** tremor *** Mvmt Root Nerve  Muscle Right Left Comments  SA C5/6 Ax Deltoid     EF C5/6 Mc Biceps     EE C6/7/8 Rad Triceps     WF C6/7 Med FCR     WE C7/8 PIN ECU     F Ab C8/T1 U ADM/FDI     HF L1/2/3 Fem Illopsoas     KE L2/3/4 Fem Quad     DF L4/5 D Peron Tib Ant     PF S1/2 Tibial Grc/Sol      Reflexes:  Right Left Comments  Pectoralis      Biceps (C5/6)     Brachioradialis (C5/6)      Triceps (C6/7)      Patellar (L3/4)      Achilles (S1)      Hoffman      Plantar     Jaw jerk    Sensation:  Light touch    Pin prick    Temperature    Vibration   Proprioception    Coordination/Complex Motor:  - Finger to Nose *** - Heel to shin *** - Rapid alternating movement *** - Gait: Stride length ***. Arm swing ***. Base width ***  Labs   CBC:  Recent Labs  Lab 07/05/20 1153  WBC 4.6  NEUTROABS 1.7  HGB 13.8  HCT 41.7  MCV 96.5  PLT 161    Basic Metabolic Panel:  Lab Results  Component Value Date   NA 140 07/05/2020   K 3.9 07/05/2020   CO2 25 07/05/2020   GLUCOSE 107 (H) 07/05/2020   BUN 13 07/05/2020   CREATININE 1.12 07/05/2020   CALCIUM 9.9 07/05/2020   GFRNONAA >60 07/05/2020   GFRAA 80 01/17/2019   Lipid Panel:  Lab Results  Component Value Date   LDLCALC 131 (H) 01/17/2019   HgbA1c: No results found for: HGBA1C Urine Drug Screen: No results found for: LABOPIA, COCAINSCRNUR, LABBENZ, AMPHETMU, THCU, LABBARB  Alcohol Level No results found for: [place]  CT Head without contrast: ***  CT angio Head and Neck with contrast: ***  MRI Brain ***  rEEG: ***  Impression    Jason Robbins is a 60 y.o. male with PMH significant for ***. His neurologic examination is notable for ***.  Recommendations  *** ______________________________________________________________________   Thank you for the opportunity to take part in the care of this patient. If you have any further questions, please contact the neurology consultation attending.  Signed,  [place] Pager Number [phone] _ _ _   _ __   _ __ _ _  __ __   _ __   __ _

## 2020-07-05 NOTE — ED Triage Notes (Signed)
Reports numbness and tingling to R side of body and difficulty ambulating since 10pm last night.  No arm drift. Denies weakness.

## 2020-07-05 NOTE — ED Notes (Signed)
Pt taken to MRI at this time

## 2020-07-05 NOTE — ED Provider Notes (Signed)
[place] EMERGENCY DEPARTMENT Provider Note   CSN: [id number] Arrival date & time: 07/05/20  1121     History Chief Complaint  Patient presents with  . Numbness    Jason Robbins is a 60 y.o. male.  Patient with history of hypertension, presenting with R sided numbness to the R side since 10 pm last night. He describes this as a pins and needles sensation, like "novocaine at the dentist". It involves his R face, arm, leg, side of torso. Denies any motor weakness. No visual changes. No difficulty speaking or swallowing. No headache, back or neck pain. No chest pain or shortness of breath. Hypertensive here, states history of same and compliant with medications at home. No blood thinners. No dizziness or lightheadedness. No fever or recent illness.        Past Medical History:  Diagnosis Date  . Allergy    RHINITIS  . Cholelithiasis   . Dyslipidemia   . GERD (gastroesophageal reflux disease)   . Herpes labialis   . Hypertension   . Psoriasis   . Renal stone   . Smoker   . Tubular adenoma of colon 12/21/2015    Patient Active Problem List   Diagnosis Date Noted  . Erectile dysfunction 01/17/2019  . Arthritis 01/17/2019  . History of colonic polyps 04/24/2017  . Hypogonadism in male 06/05/2014  . Hypertension 11/26/2012  . Hyperlipidemia with target LDL less than 130 11/26/2012  . Current smoker 11/26/2012  . Psoriasis 11/26/2012  . Allergic rhinitis due to pollen 11/26/2012    Past Surgical History:  Procedure Laterality Date  . KIDNEY STONE SURGERY    . NASAL SINUS SURGERY         Family History  Problem Relation Age of Onset  . Hypertension Mother   . Stroke Mother   . Cancer Mother        biliary duct cancer  . Hypertension Father   . Stroke Father   . Hypertension Sister   . Heart disease Sister   . Colon cancer Neg Hx     Social History   Tobacco Use  . Smoking status: Current Every Day Smoker    Packs/day: 0.25     Types: Cigarettes  . Smokeless tobacco: Never Used  . Tobacco comment: 1 pack monthly; uses e-cigs inbetween; trying to quit  Vaping Use  . Vaping Use: Never used  Substance Use Topics  . Alcohol use: Yes    Alcohol/week: 0.0 standard drinks    Comment: holidays  . Drug use: No    Home Medications Prior to Admission medications   Medication Sig Start Date End Date Taking? Authorizing Provider  Chlorpheniramine-PSE-Ibuprofen (ADVIL ALLERGY SINUS PO) Take 1 tablet by mouth as needed (congestion/pain).   Yes [provider]  fenofibrate 160 MG tablet TAKE 1 TABLET BY MOUTH EVERY DAY Patient taking differently: Take 160 mg by mouth daily. 06/29/20  Yes Denita Lung, MD  lisinopril-hydrochlorothiazide (ZESTORETIC) 20-12.5 MG tablet TAKE 1 TABLET BY MOUTH EVERY DAY Patient taking differently: Take 1 tablet by mouth daily. 06/29/20  Yes Denita Lung, MD  meloxicam (MOBIC) 15 MG tablet TAKE 1 TABLET BY MOUTH EVERY DAY Patient taking differently: Take 7.5 mg by mouth daily as needed for pain. 06/08/20  Yes Denita Lung, MD  sodium chloride (OCEAN) 0.65 % SOLN nasal spray Place 1 spray into both nostrils as needed for congestion.   Yes [provider]  triamcinolone (KENALOG) 0.1 % Apply 1  application topically 4 (four) times daily as needed (itchy rash).   Yes [provider]  verapamil (VERELAN PM) 240 MG 24 hr capsule TAKE 1 CAPSULE (240 MG TOTAL) BY MOUTH AT BEDTIME. 06/29/20  Yes Denita Lung, MD    Allergies    Sulfa antibiotics  Review of Systems   Review of Systems  Constitutional: Negative for activity change, appetite change and fever.  HENT: Negative for congestion and rhinorrhea.   Eyes: Negative for photophobia and visual disturbance.  Respiratory: Negative for cough, chest tightness and shortness of breath.   Cardiovascular: Negative for chest pain, palpitations and leg swelling.  Gastrointestinal: Negative for abdominal pain, nausea and  vomiting.  Genitourinary: Negative for dysuria and hematuria.  Musculoskeletal: Negative for arthralgias and myalgias.  Neurological: Positive for numbness. Negative for dizziness, facial asymmetry, speech difficulty, weakness and headaches.   all other systems are negative except as noted in the HPI and PMH.    Physical Exam Updated Vital Signs BP (!) 185/115 (BP Location: Left Arm)   Pulse 100   Temp 98.6 F (37 C) (Oral)   Resp 18   Ht 5\' 11"  (1.803 m)   Wt 106.6 kg   SpO2 100%   BMI 32.78 kg/m   Physical Exam Vitals and nursing note reviewed.  Constitutional:      General: He is not in acute distress.    Appearance: He is well-developed and well-nourished.  HENT:     Head: Normocephalic and atraumatic.     Mouth/Throat:     Mouth: Oropharynx is clear and moist.     Pharynx: No oropharyngeal exudate.  Eyes:     Extraocular Movements: EOM normal.     Conjunctiva/sclera: Conjunctivae normal.     Pupils: Pupils are equal, round, and reactive to light.  Neck:     Comments: No meningismus. Cardiovascular:     Rate and Rhythm: Normal rate and regular rhythm.     Pulses: Intact distal pulses.     Heart sounds: Normal heart sounds. No murmur heard.   Pulmonary:     Effort: Pulmonary effort is normal. No respiratory distress.     Breath sounds: Normal breath sounds.  Abdominal:     Palpations: Abdomen is soft.     Tenderness: There is no abdominal tenderness. There is no guarding or rebound.  Musculoskeletal:        General: No tenderness or edema. Normal range of motion.     Cervical back: Normal range of motion and neck supple.  Skin:    General: Skin is warm.  Neurological:     Mental Status: He is alert and oriented to person, place, and time.     Cranial Nerves: No cranial nerve deficit.     Motor: No abnormal muscle tone.     Coordination: Coordination normal.     Comments: CN 2-12 intact, no ataxia on finger to nose, no nystagmus, 5/5 strength throughout, no  pronator drift, Romberg negative, normal gait. No facial droop.  Subjective numbness to R face, arm, leg.   5/5 strength in bilateral lower extremities. Ankle plantar and dorsiflexion intact. Great toe extension intact bilaterally. +2 DP and PT pulses. +2 patellar reflexes bilaterally. Normal gait.   Psychiatric:        Mood and Affect: Mood and affect normal.        Behavior: Behavior normal.     ED Results / Procedures / Treatments   Labs (all labs ordered are listed, but only abnormal  results are displayed) Labs Reviewed  COMPREHENSIVE METABOLIC PANEL - Abnormal; Notable for the following components:      Result Value   Glucose, Bld 107 (*)    All other components within normal limits  SARS CORONAVIRUS 2 (TAT 6-24 HRS)  PROTIME-INR  APTT  CBC  DIFFERENTIAL  MAGNESIUM  HIV ANTIBODY (ROUTINE TESTING W REFLEX)  HEMOGLOBIN A1C  LIPID PANEL  URINALYSIS, DIPSTICK ONLY    EKG EKG Interpretation  Date/Time:  Sunday July 05 2020 11:45:09 EST Ventricular Rate:  87 PR Interval:  162 QRS Duration: 94 QT Interval:  368 QTC Calculation: 442 R Axis:   47 Text Interpretation: Normal sinus rhythm Normal ECG No STEMI Confirmed by Octaviano Glow [phone]) on 07/05/2020 2:06:50 PM   Radiology DG Chest 1 View  Result Date: 07/05/2020 CLINICAL DATA:  Screening for MRI EXAM: CHEST  1 VIEW COMPARISON:  10/24/2013 FINDINGS: The heart size and mediastinal contours are within normal limits. Both lungs are clear. The visualized skeletal structures are unremarkable. No radiopaque foreign bodies. IMPRESSION: No active disease. Electronically Signed   By: Rolm Baptise M.D.   On: 07/05/2020 19:22   CT HEAD WO CONTRAST  Result Date: 07/05/2020 CLINICAL DATA:  Right-sided body numbness. EXAM: CT HEAD WITHOUT CONTRAST TECHNIQUE: Contiguous axial images were obtained from the base of the skull through the vertex without intravenous contrast. COMPARISON:  None. FINDINGS: Brain: Brain does not show  accelerated atrophy. 1 cm low-density at the base of the brain on the right probably represents a dilated perivascular space. No evidence focal infarction, mass lesion, hemorrhage, hydrocephalus or extra-axial collection. Vascular: There is atherosclerotic calcification of the major vessels at the base of the brain. Skull: Negative Sinuses/Orbits: Clear. Previous functional endoscopic sinus surgery. Orbits negative. Other: None IMPRESSION: No acute finding by CT. 1 cm low-density at the base of the brain on the right probably represents a dilated perivascular space. Electronically Signed   By: Nelson Chimes M.D.   On: 07/05/2020 12:32   MR Brain W and Wo Contrast  Result Date: 07/05/2020 CLINICAL DATA:  Right-sided numbness EXAM: MRI HEAD WITHOUT AND WITH CONTRAST TECHNIQUE: Multiplanar, multiecho pulse sequences of the brain and surrounding structures were obtained without and with intravenous contrast. CONTRAST:  44mL GADAVIST GADOBUTROL 1 MMOL/ML IV SOLN COMPARISON:  None. FINDINGS: Brain: Subcentimeter focus of restricted diffusion is present within the left thalamus. Punctate focus of susceptibility within the left thalamus separate from above likely reflects chronic microhemorrhage. Patchy foci of T2 hyperintensity in the supratentorial white matter are nonspecific but probably reflect mild chronic microvascular ischemic changes. Small chronic left caudate head infarct. Ventricles and sulci are within normal limits in size and configuration. There is no intracranial mass or mass effect. There is no hydrocephalus or extra-axial fluid collection. No abnormal enhancement. Vascular: Major vessel flow voids at the skull base are preserved. Skull and upper cervical spine: Normal marrow signal is preserved. Sinuses/Orbits: Mild mucosal thickening.  Orbits are unremarkable. Other: Sella is unremarkable.  Mastoid air cells are clear. IMPRESSION: Small acute left thalamic infarct consistent with reported history. Mild  chronic microvascular ischemic changes. Small chronic left caudate infarct. Left thalamic chronic microhemorrhage likely secondary to hypertension. Electronically Signed   By: Macy Mis M.D.   On: 07/05/2020 19:23    Procedures Procedures (including critical care time)  Medications Ordered in ED Medications  sodium chloride flush (NS) 0.9 % injection 3 mL (has no administration in time range)    ED Course  I  have reviewed the triage vital signs and the nursing notes.  Pertinent labs & imaging results that were available during my care of the patient were reviewed by me and considered in my medical decision making (see chart for details).    MDM Rules/Calculators/A&P                          R sided numbness since last night. No motor weakness. CT in triage is negative.  Code stroke not activated due to numbness only with minimal deficits. Not tPA candidate due to delay in presentation.  Labs are reassuring.  Patient is hypertensive and reports he has been taking his medication as prescribed.  Denies headache, chest pain or shortness of breath.  CT head in triage is negative.  MRI pursued given his right-sided numbness.  This shows a small left thalamic infarct.  Discussed with Dr. Lorrin Goodell neurology who will evaluate patient.  He agrees with aspirin and will plan for medical admission for stroke work-up.  Patient is agreeable.  Permissive hypertension at this time.  Admission discussed with Dr. Roel Cluck.  Final Clinical Impression(s) / ED Diagnoses Final diagnoses:  Right sided numbness  Cerebrovascular accident (CVA), unspecified mechanism [facility])    Rx / Gay Orders ED Discharge Orders    None       Nefertari Rebman, Annie Main, MD 07/05/20 2125

## 2020-07-05 NOTE — H&P (Signed)
Jason Robbins RJJ:[id number] DOB: 11/26/1960 DOA: 07/05/2020     PCP: Denita Lung, MD   [facility]    Patient arrived to ER on 07/05/20 at 1121 Referred by Attending Rancour, Annie Main, MD   Patient coming from: home Lives   With family    Chief Complaint:    Chief Complaint  Patient presents with  . Numbness    HPI: Jason Robbins is a 60 y.o. male with medical history significant of HTN, tobacco abuse, HLD    Presented with numbness and tingling of right side of his body and difficulty with ambulating started at 10 PM last night no slurred speech, no weakness, no prior history of CVA Described as pins-and-needles sensation like he got numbed by dentist.  Also noted some on his right face.  No associated headache no chest pain or shortness of breath. His blood pressure been running a bit high. He is not on blood thinners. No recent fevers or chills. He continues to smoke but been trying to quit  Infectious risk factors:  Reports none   Has   been vaccinated against COVID not boosted   Initial COVID TEST   in house  PCR testing  Pending  No results found for: SARSCOV2NAA   Regarding pertinent Chronic problems:     Hyperlipidemia -  on fenofibrate Lipid Panel     Component Value Date/Time   CHOL 188 01/17/2019 1052   TRIG 95 01/17/2019 1052   HDL 38 (L) 01/17/2019 1052   CHOLHDL 4.9 01/17/2019 1052   CHOLHDL 4.6 04/24/2017 0857   VLDL 53 (H) 04/16/2014 1601   LDLCALC 131 (H) 01/17/2019 1052   LDLCALC 120 (H) 04/24/2017 0857   LABVLDL 19 01/17/2019 1052     HTN on lisinopril/hydrochlorothiazide and verapamil    While in ER: Noted to be initially hypertensive blood pressures 185 /115 Out of the window for TPA as his symptoms started last night. Permissive hypertension Neurology has been consulted Initial CT negative MRI confirmed CVA  ER Provider Called:   Neurology Dr. Lorrin Goodell They Recommend admit to medicine aspirin Plavix Will see   in ER  Hospitalist was called for admission for new CVA  The following Work up has been ordered so far:  Orders Placed This Encounter  Procedures  . SARS CORONAVIRUS 2 (TAT 6-24 HRS) Nasopharyngeal Nasopharyngeal Swab  . CT HEAD WO CONTRAST  . MR Brain W and Wo Contrast  . DG Chest Portable 1 View  . Protime-INR  . APTT  . CBC  . Differential  . Comprehensive metabolic panel  . Magnesium  . Diet NPO time specified  . Cardiac monitoring  . Stroke swallow screen  . NIH Stroke Scale  . Modified Stroke Scale (mNIHSS) Document mNIHSS assessment every 2 hours for a total of 12 hours  . Saline Lock IV, Maintain IV access  . If O2 Sat <94% administer O2 at 2 liters/minute via nasal cannula  . Consult to neurology  ALL PATIENTS BEING ADMITTED/HAVING PROCEDURES NEED COVID-19 SCREENING  . Consult to hospitalist  ALL PATIENTS BEING ADMITTED/HAVING PROCEDURES NEED COVID-19 SCREENING  . Airborne and Contact precautions  . Pulse oximetry, continuous  . Adult wheeze protocol - initiate by RT  . ED EKG   Following Medications were ordered in ER: Medications  aspirin chewable tablet 81 mg (0 mg Oral Hold 07/05/20 1829)    Or  aspirin suppository 300 mg ( Rectal See Alternative 07/05/20 1829)  clopidogrel (PLAVIX) tablet  75 mg (0 mg Oral Hold 07/05/20 1829)  sodium chloride flush (NS) 0.9 % injection 3 mL (3 mLs Intravenous Given 07/05/20 1829)  gadobutrol (GADAVIST) 1 MMOL/ML injection 10 mL (10 mLs Intravenous Contrast Given 07/05/20 1704)        Consult Orders  (From admission, onward)         Start     Ordered   07/05/20 1752  Consult to hospitalist  ALL PATIENTS BEING ADMITTED/HAVING PROCEDURES NEED COVID-19 SCREENING Paged to Dr Roel Cluck @ 1807  Once       Comments: ALL PATIENTS BEING ADMITTED/HAVING PROCEDURES NEED COVID-19 SCREENING  Provider:  (Not yet assigned)  Question Answer Comment  Place call to: [facility]   Reason for Consult Admit      07/05/20 1751            Significant initial  Findings: Abnormal Labs Reviewed  COMPREHENSIVE METABOLIC PANEL - Abnormal; Notable for the following components:      Result Value   Glucose, Bld 107 (*)    All other components within normal limits     Otherwise labs showing:    Recent Labs  Lab 07/05/20 1153 07/05/20 1546  NA 140  --   K 3.9  --   CO2 25  --   GLUCOSE 107*  --   BUN 13  --   CREATININE 1.12  --   CALCIUM 9.9  --   MG  --  1.9    Cr   stable,   Lab Results  Component Value Date   CREATININE 1.12 07/05/2020   CREATININE 1.16 01/17/2019   CREATININE 1.18 07/18/2018    Recent Labs  Lab 07/05/20 1153  AST 20  ALT 25  ALKPHOS 42  BILITOT 0.5  PROT 7.5  ALBUMIN 4.3   Lab Results  Component Value Date   CALCIUM 9.9 07/05/2020     WBC      Component Value Date/Time   WBC 4.6 07/05/2020 1153   LYMPHSABS 1.9 07/05/2020 1153   LYMPHSABS 1.9 01/17/2019 1052   MONOABS 0.8 07/05/2020 1153   EOSABS 0.2 07/05/2020 1153   EOSABS 0.4 01/17/2019 1052   BASOSABS 0.1 07/05/2020 1153   BASOSABS 0.0 01/17/2019 1052    Plt: Lab Results  Component Value Date   PLT 291 07/05/2020    HG/HCT  stable,       Component Value Date/Time   HGB 13.8 07/05/2020 1153   HGB 13.7 01/17/2019 1052   HCT 41.7 07/05/2020 1153   HCT 40.5 01/17/2019 1052   MCV 96.5 07/05/2020 1153   MCV 95 01/17/2019 1052       ECG: Ordered Personally reviewed by me showing: HR : 87 Rhythm:  NSR,     no evidence of ischemic changes QTC 442       UA not ordered       Ordered  CT HEAD   NON acute  CXR - NON acute MRI brain Small acute left thalamic infarct    ED Triage Vitals  Enc Vitals Group     BP 07/05/20 1144 (!) 185/115     Pulse Rate 07/05/20 1144 100     Resp 07/05/20 1144 18     Temp 07/05/20 1144 98.6 F (37 C)     Temp Source 07/05/20 1144 Oral     SpO2 07/05/20 1144 100 %     Weight 07/05/20 1144 235 lb (106.6 kg)     Height 07/05/20 1144 5\' 11"  (1.803  m)     Head  Circumference --      Peak Flow --      Pain Score 07/05/20 1145 0     Pain Loc --      Pain Edu? --      Excl. in [place]? --   TMAX(24)@       Latest  Blood pressure (!) 210/103, pulse 79, temperature 98.2 F (36.8 C), temperature source Oral, resp. rate 18, height 5\' 11"  (1.803 m), weight 106.6 kg, SpO2 99 %.    Review of Systems:    Pertinent positives include: Right-sided tingling  Constitutional:  No weight loss, night sweats, Fevers, chills, fatigue, weight loss  HEENT:  No headaches, Difficulty swallowing,Tooth/dental problems,Sore throat,  No sneezing, itching, ear ache, nasal congestion, post nasal drip,  Cardio-vascular:  No chest pain, Orthopnea, PND, anasarca, dizziness, palpitations.no Bilateral lower extremity swelling  GI:  No heartburn, indigestion, abdominal pain, nausea, vomiting, diarrhea, change in bowel habits, loss of appetite, melena, blood in stool, hematemesis Resp:  no shortness of breath at rest. No dyspnea on exertion, No excess mucus, no productive cough, No non-productive cough, No coughing up of blood.No change in color of mucus.No wheezing. Skin:  no rash or lesions. No jaundice GU:  no dysuria, change in color of urine, no urgency or frequency. No straining to urinate.  No flank pain.  Musculoskeletal:  No joint pain or no joint swelling. No decreased range of motion. No back pain.  Psych:  No change in mood or affect. No depression or anxiety. No memory loss.  Neuro  no weakness, no double vision, no gait abnormality, no slurred speech, no confusion  All systems reviewed and apart from [place] all are negative  Past Medical History:   Past Medical History:  Diagnosis Date  . Allergy    RHINITIS  . Cholelithiasis   . Dyslipidemia   . GERD (gastroesophageal reflux disease)   . Herpes labialis   . Hypertension   . Psoriasis   . Renal stone   . Smoker   . Tubular adenoma of colon 12/21/2015      Past Surgical History:  Procedure  Laterality Date  . KIDNEY STONE SURGERY    . NASAL SINUS SURGERY      Social History:  Ambulatory  independently       reports that he has been smoking cigarettes. He has been smoking about 0.25 packs per day. He has never used smokeless tobacco. He reports current alcohol use. He reports that he does not use drugs.   Family History:   Family History  Problem Relation Age of Onset  . Hypertension Mother   . Stroke Mother   . Cancer Mother        biliary duct cancer  . Hypertension Father   . Stroke Father   . Hypertension Sister   . Heart disease Sister   . Colon cancer Neg Hx     Allergies: Allergies  Allergen Reactions  . Sulfa Antibiotics Swelling     Prior to Admission medications   Medication Sig Start Date End Date Taking? Authorizing Provider  Chlorpheniramine-PSE-Ibuprofen (ADVIL ALLERGY SINUS PO) Take 1 tablet by mouth as needed (congestion/pain).   Yes [provider]  fenofibrate 160 MG tablet TAKE 1 TABLET BY MOUTH EVERY DAY Patient taking differently: Take 160 mg by mouth daily. 06/29/20  Yes Denita Lung, MD  lisinopril-hydrochlorothiazide (ZESTORETIC) 20-12.5 MG tablet TAKE 1 TABLET BY MOUTH EVERY DAY Patient taking differently: Take  1 tablet by mouth daily. 06/29/20  Yes Denita Lung, MD  meloxicam (MOBIC) 15 MG tablet TAKE 1 TABLET BY MOUTH EVERY DAY Patient taking differently: Take 7.5 mg by mouth daily as needed for pain. 06/08/20  Yes Denita Lung, MD  sodium chloride (OCEAN) 0.65 % SOLN nasal spray Place 1 spray into both nostrils as needed for congestion.   Yes [provider]  triamcinolone (KENALOG) 0.1 % Apply 1 application topically 4 (four) times daily as needed (itchy rash).   Yes [provider]  verapamil (VERELAN PM) 240 MG 24 hr capsule TAKE 1 CAPSULE (240 MG TOTAL) BY MOUTH AT BEDTIME. 06/29/20  Yes Denita Lung, MD   Physical Exam: Vitals with BMI 07/05/2020 07/05/2020 07/05/2020  Height - - -  Weight - - -   BMI - - -  Systolic [id number]  Diastolic [id number] 99 98  Pulse 79 76 71     1. General:  in No  Acute distress    Chronically ill  -appearing 2. Psychological: Alert and  Oriented 3. Head/ENT:  Dry Mucous Membranes                          Head Non traumatic, neck supple                        Poor Dentition 4. SKIN:  decreased Skin turgor,  Skin clean Dry and intact no rash 5. Heart: Regular rate and rhythm no  Murmur, no Rub or gallop 6. Lungs:  no wheezes or crackles   7. Abdomen: Soft, non-tender, Non distended    bowel sounds present 8. Lower extremities: no clubbing, cyanosis, no edema 9. Neurologically   strength 5 out of 5 in all 4 extremities cranial nerves II through XII intact diminished sensation right 10. MSK: Normal range of motion   All other LABS:     Recent Labs  Lab 07/05/20 1153  WBC 4.6  NEUTROABS 1.7  HGB 13.8  HCT 41.7  MCV 96.5  PLT 291     Recent Labs  Lab 07/05/20 1153 07/05/20 1546  NA 140  --   K 3.9  --   CL 103  --   CO2 25  --   GLUCOSE 107*  --   BUN 13  --   CREATININE 1.12  --   CALCIUM 9.9  --   MG  --  1.9     Recent Labs  Lab 07/05/20 1153  AST 20  ALT 25  ALKPHOS 42  BILITOT 0.5  PROT 7.5  ALBUMIN 4.3       Cultures: No results found for: SDES, SPECREQUEST, CULT, REPTSTATUS   Radiological Exams on Admission: CT HEAD WO CONTRAST  Result Date: 07/05/2020 CLINICAL DATA:  Right-sided body numbness. EXAM: CT HEAD WITHOUT CONTRAST TECHNIQUE: Contiguous axial images were obtained from the base of the skull through the vertex without intravenous contrast. COMPARISON:  None. FINDINGS: Brain: Brain does not show accelerated atrophy. 1 cm low-density at the base of the brain on the right probably represents a dilated perivascular space. No evidence focal infarction, mass lesion, hemorrhage, hydrocephalus or extra-axial collection. Vascular: There is atherosclerotic calcification of the major vessels at the base of the brain.  Skull: Negative Sinuses/Orbits: Clear. Previous functional endoscopic sinus surgery. Orbits negative. Other: None IMPRESSION: No acute finding by CT. 1 cm low-density at the base of the brain on  the right probably represents a dilated perivascular space. Electronically Signed   By: Nelson Chimes M.D.   On: 07/05/2020 12:32    Chart has been reviewed    Assessment/Plan  60 y.o. male with medical history significant of HTN, tobacco abuse, HLD     Admitted for CVA  Present on Admission: . 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        Order PT/OT evaluation.         passed swallow eval         Will make sure patient is on antiplatelet ASA 81 Plavix agent and statin        Allow permissive Hypertension keep BP <220/120        Neurology consulted Have seen pt in ER   . Hypertension - allow permissive HTN   . Hyperlipidemia with target LDL less than 130 - start on Lipitor  . Tobacco abuse -  - Spoke about importance of quitting spent 5 minutes discussing options for treatment, prior attempts at quitting, and dangers of smoking  -At this point patient is    interested in quitting  - order nicotine patch   - nursing tobacco cessation protocol   Other plan as per orders.  DVT prophylaxis:   scd       Code Status:    Code Status: Not on file FULL CODE as per patient  I had personally discussed CODE STATUS with patient     Family Communication:   Family not at  Bedside  plan of care was discussed  with   Wife   Disposition Plan:     To home once workup is complete and patient is stable   Following barriers for discharge:                                                      Will need consultants to evaluate patient prior to discharge                      Would benefit from PT/OT eval prior to DC  Ordered                                      Consults called:  Neurology is aware  Admission status:  ED Disposition    ED Disposition Condition [place]: [place] [[id number]]  Level of Care: Telemetry Medical [[id number]]  May admit patient to [facility] or [facility] if equivalent level of care is available:: No  Covid Evaluation: Asymptomatic Screening Protocol (No Symptoms)  Diagnosis: Stroke [id number]  Admitting Physician: Toy Baker [[id number]]  Attending Physician: Toy Baker [[id number]]  Estimated length of stay: past midnight tomorrow  Certification:: I certify this patient will need inpatient services for at least  2 midnights           inpatient     I Expect 2 midnight stay secondary to severity of patient's current illness need for inpatient interventions justified by the following:  hemodynamic instability despite optimal treatment ( hypertension )   Severe lab/radiological/exam abnormalities including:    new CVA .    That are currently affecting medical management.   I expect  patient to be hospitalized for 2 midnights requiring inpatient medical care.  Patient is at high risk for adverse outcome (such as loss of life or disability) if not treated.  Indication for inpatient stay as follows:  new CVA    Level of care     Tele  indefinitely please discontinue once patient no longer qualifies COVID-19 Labs   No results found for: SARSCOV2NAA   Precautions: admitted as asymptomatic screening protocol  Airborne and Contact precautions  If Covid PCR is negative  - please DC precautions   PPE: Used by the provider:   P100  eye Goggles,  Gloves    Gyselle Matthew 07/05/2020, 10:13 PM    [facility]     after 2 AM please page floor coverage PA If 7AM-7PM, please contact the day team taking care of the patient using [email or web address]   Patient was evaluated in  the context of the global COVID-19 pandemic, which necessitated consideration that the patient might be at risk for infection with the SARS-CoV-2 virus that causes COVID-19. Institutional protocols and algorithms that pertain to the evaluation of patients at risk for COVID-19 are in a state of rapid change based on information released by regulatory bodies including the CDC and federal and state organizations. These policies and algorithms were followed during the patient's care.

## 2020-07-05 NOTE — Consult Note (Signed)
Neurology Consultation Reason for Consult: Stroke Referring Physician: Roel Cluck, a  CC: Right-sided numbness  History is obtained from: Patient  HPI: Jason Robbins is a 60 y.o. male with a history of hypertension who presents with numbness that started abruptly around 10 PM last night.  He was hoping it would get better and therefore lay down went to sleep, but when he woke up this morning it was still present and therefore sought further care in the emergency department.   LKW: 10 PM tpa given?: no, outside window    ROS: A 14 point ROS was performed and is negative except as noted in the HPI.  Past Medical History:  Diagnosis Date  . Allergy    RHINITIS  . Cholelithiasis   . Dyslipidemia   . GERD (gastroesophageal reflux disease)   . Herpes labialis   . Hypertension   . Psoriasis   . Renal stone   . Smoker   . Tubular adenoma of colon 12/21/2015     Family History  Problem Relation Age of Onset  . Hypertension Mother   . Stroke Mother   . Cancer Mother        biliary duct cancer  . Hypertension Father   . Stroke Father   . Hypertension Sister   . Heart disease Sister   . Colon cancer Neg Hx      Social History:  reports that he has been smoking cigarettes. He has been smoking about 0.25 packs per day. He has never used smokeless tobacco. He reports current alcohol use. He reports that he does not use drugs.   Exam: Current vital signs: BP (!) 214/105 (BP Location: Right Arm)   Pulse 75   Temp 98.2 F (36.8 C) (Oral)   Resp 20   Ht 5\' 11"  (1.803 m)   Wt 106.6 kg   SpO2 99%   BMI 32.78 kg/m  Vital signs in last 24 hours: Temp:  [98.2 F (36.8 C)-98.6 F (37 C)] 98.2 F (36.8 C) (01/09 1523) Pulse Rate:  [71-100] 75 (01/09 1846) Resp:  [16-20] 20 (01/09 1846) BP: (185-214)/(98-115) 214/105 (01/09 1951) SpO2:  [97 %-100 %] 99 % (01/09 1846) Weight:  [106.6 kg] 106.6 kg (01/09 1144)   Physical Exam  Constitutional: Appears well-developed and  well-nourished.  Psych: Affect appropriate to situation Eyes: No scleral injection HENT: No OP obstrucion MSK: no joint deformities.  Cardiovascular: Normal rate and regular rhythm.  Respiratory: Effort normal, non-labored breathing GI: Soft.  No distension. There is no tenderness.  Skin: WDI  Neuro: Mental Status: Patient is awake, alert, oriented to person, place, month, year, and situation. Patient is able to give a clear and coherent history. No signs of aphasia or neglect Cranial Nerves: II: Visual Fields are full. Pupils are equal, round, and reactive to light.   III,IV, VI: EOMI without ptosis or diploplia.  V: Facial sensation is diminished in the right VII: Facial movement is symmetric.  VIII: hearing is intact to voice X: Uvula elevates symmetrically XI: Shoulder shrug is symmetric. XII: tongue is midline without atrophy or fasciculations.  Motor: Tone is normal. Bulk is normal. 5/5 strength was present in all four extremities.  Sensory: Sensation is diminished on the right Cerebellar: FNF and HKS are intact bilaterally   I have reviewed labs in epic and the results pertinent to this consultation are: CMP-unremarkable  I have reviewed the images obtained: Small acute left flank stroke  Impression: 60 year old male with small vessel appearing stroke.  He has risk factor of hypertension and tobacco abuse, and I have advised him that smoking cessation is necessary.  We will need to be admitted for secondary risk factor modification.  Recommendations: - HgbA1c, fasting lipid panel -CTA head neck- Frequent neuro checks - Echocardiogram - Prophylactic therapy-aspirin 81 mg and Plavix 300 mg x 1 followed by 75 mg daily - Risk factor modification - Telemetry monitoring - PT consult, OT consult, Speech consult - Stroke team to follow    Roland Rack, MD [facility] [phone]  If 7pm- 7am, please page neurology on call as listed in  [place].

## 2020-07-06 ENCOUNTER — Inpatient Hospital Stay (HOSPITAL_BASED_OUTPATIENT_CLINIC_OR_DEPARTMENT_OTHER): Payer: BC Managed Care – PPO

## 2020-07-06 DIAGNOSIS — E785 Hyperlipidemia, unspecified: Secondary | ICD-10-CM

## 2020-07-06 DIAGNOSIS — Z720 Tobacco use: Secondary | ICD-10-CM

## 2020-07-06 DIAGNOSIS — I10 Essential (primary) hypertension: Secondary | ICD-10-CM

## 2020-07-06 DIAGNOSIS — I639 Cerebral infarction, unspecified: Secondary | ICD-10-CM | POA: Diagnosis present

## 2020-07-06 DIAGNOSIS — I6389 Other cerebral infarction: Secondary | ICD-10-CM | POA: Diagnosis not present

## 2020-07-06 DIAGNOSIS — R200 Anesthesia of skin: Secondary | ICD-10-CM | POA: Insufficient documentation

## 2020-07-06 LAB — RAPID URINE DRUG SCREEN, HOSP PERFORMED
Amphetamines: NOT DETECTED
Barbiturates: NOT DETECTED
Benzodiazepines: NOT DETECTED
Cocaine: NOT DETECTED
Opiates: NOT DETECTED
Tetrahydrocannabinol: NOT DETECTED

## 2020-07-06 LAB — ECHOCARDIOGRAM COMPLETE
Area-P 1/2: 2.5 cm2
Height: 71 in
S' Lateral: 3.4 cm
Weight: 3760 oz

## 2020-07-06 LAB — URINALYSIS, DIPSTICK ONLY
Bilirubin Urine: NEGATIVE
Glucose, UA: NEGATIVE mg/dL
Ketones, ur: NEGATIVE mg/dL
Leukocytes,Ua: NEGATIVE
Nitrite: NEGATIVE
Protein, ur: NEGATIVE mg/dL
Specific Gravity, Urine: 1.038 — ABNORMAL HIGH (ref 1.005–1.030)
pH: 8 (ref 5.0–8.0)

## 2020-07-06 LAB — LIPID PANEL
Cholesterol: 177 mg/dL (ref 0–200)
HDL: 33 mg/dL — ABNORMAL LOW (ref >40)
LDL Cholesterol: 108 mg/dL — ABNORMAL HIGH (ref 0–99)
Total CHOL/HDL Ratio: 5.4 RATIO
Triglycerides: 181 mg/dL — ABNORMAL HIGH (ref <150)
VLDL: 36 mg/dL (ref 0–40)

## 2020-07-06 LAB — SARS CORONAVIRUS 2 (TAT 6-24 HRS): SARS Coronavirus 2: NEGATIVE

## 2020-07-06 LAB — HEMOGLOBIN A1C
Hgb A1c MFr Bld: 5.5 % (ref 4.8–5.6)
Mean Plasma Glucose: 111.15 mg/dL

## 2020-07-06 LAB — HIV ANTIBODY (ROUTINE TESTING W REFLEX): HIV Screen 4th Generation wRfx: NONREACTIVE

## 2020-07-06 MED ORDER — ATORVASTATIN CALCIUM 80 MG PO TABS: 80.0000 mg | ORAL_TABLET | Freq: Every day | ORAL | 1 refills | Status: DC

## 2020-07-06 MED ORDER — NICOTINE 21 MG/24HR TD PT24: 21.0000 mg | MEDICATED_PATCH | Freq: Every day | TRANSDERMAL | 0 refills | Status: DC

## 2020-07-06 MED ORDER — ASPIRIN 81 MG PO CHEW: 81.0000 mg | CHEWABLE_TABLET | Freq: Every day | ORAL | Status: AC

## 2020-07-06 MED ORDER — CLOPIDOGREL BISULFATE 75 MG PO TABS: 75.0000 mg | ORAL_TABLET | Freq: Every day | ORAL | 0 refills | Status: DC

## 2020-07-06 NOTE — ED Notes (Signed)
Lunch Tray Ordered 1115. 

## 2020-07-06 NOTE — ED Notes (Signed)
Attempted to call report

## 2020-07-06 NOTE — ED Notes (Signed)
Tele  Breakfast Ordered 

## 2020-07-06 NOTE — Progress Notes (Addendum)
STROKE TEAM PROGRESS NOTE   HPI per record: Jason Robbins is a 60 y.o. male with medical history significant of HTN, tobacco abuse, HLD Presented with numbness and tingling of right side of his body and difficulty with ambulating started at 10 PM last night no slurred speech, no weakness, no prior history of CVA Described as pins-and-needles sensation like he got numbed by dentist.  Also noted some on his right face.  No associated headache no chest pain or shortness of breath. His blood pressure been running a bit high.He is not on blood thinners.No recent fevers or chills. He continues to smoke but been trying to quit  INTERVAL HISTORY No acute overnight events. Patient evaluated at bedside this morning with wife present.  He states he feels better this morning except ongoing numbness on the right side of his body.  He describes them as pins and needles sensation like after you get numbed at the dentist's office.  Advised for better control of blood pressure, smoking cessation and cholesterol.  Patient shows good understanding.  After working with PT/OT if advisable, patient should be able to go home and follow-up with his PCP tomorrow for better control of blood pressure.  Dr. Ree Kida updated about the plan.  Blood pressure well controlled.  Neurological exam unchanged.   Vitals:   07/06/20 0155 07/06/20 0517 07/06/20 0803 07/06/20 1015  BP: (!) 164/88 (!) 162/82 (!) 170/83 139/60  Pulse: 65 68 65 74  Resp: 15 18 16 19   Temp: 98 F (36.7 C) 98.4 F (36.9 C) 98.3 F (36.8 C) 98 F (36.7 C)  TempSrc: Oral Oral Oral Oral  SpO2: 97% 98% 99% 98%  Weight:      Height:       CBC:  Recent Labs  Lab 07/05/20 1153  WBC 4.6  NEUTROABS 1.7  HGB 13.8  HCT 41.7  MCV 96.5  PLT [id number]   Basic Metabolic Panel:  Recent Labs  Lab 07/05/20 1153 07/05/20 1546  NA 140  --   K 3.9  --   CL 103  --   CO2 25  --   GLUCOSE 107*  --   BUN 13  --   CREATININE 1.12  --   CALCIUM 9.9  --   MG   --  1.9   Lipid Panel:  Recent Labs  Lab 07/06/20 0519  CHOL 177  TRIG 181*  HDL 33*  CHOLHDL 5.4  VLDL 36  LDLCALC 108*   HgbA1c:  Recent Labs  Lab 07/06/20 0519  HGBA1C 5.5   Urine Drug Screen:  Recent Labs  Lab 07/06/20 0044  LABOPIA NONE DETECTED  COCAINSCRNUR NONE DETECTED  LABBENZ NONE DETECTED  AMPHETMU NONE DETECTED  THCU NONE DETECTED  LABBARB NONE DETECTED    IMAGING past 24 hours  CT Head wo contrast 07/06/2019  IMPRESSION: No acute finding by CT. 1 cm low-density at the base of the brain on the right probably represents a dilated perivascular space.  MR Brain w and wo contrast 07/06/2019  IMPRESSION: Small acute left thalamic infarct consistent with reported history.  Mild chronic microvascular ischemic changes. Small chronic left caudate infarct. Left thalamic chronic microhemorrhage likely secondary to hypertension.  Chest x-ray: 07/06/2019  IMPRESSION: No active disease.  CT angio head w or wo contrast 07/06/2019  CTA HEAD FINDINGS  Anterior circulation: Both internal carotid arteries widely patent to the termini without stenosis. Minimal atheromatous plaque noted within the carotid siphons. 2 mm outpouching arising from the  supraclinoid right ICA favored to reflect a small vascular infundibulum related to a hypoplastic right posterior communicating artery. A1 segments widely patent. Normal anterior communicating artery complex. Anterior cerebral arteries patent to their distal aspects without stenosis. No M1 stenosis or occlusion. Left M1 bifurcates early. Distal MCA branches well perfused and symmetric.  Posterior circulation: Both V4 segments widely patent to the vertebrobasilar junction without stenosis. Neither PICA origin well visualized. Basilar widely patent to its distal aspect without stenosis. Superior cerebellar arteries patent bilaterally. Both PCAs primarily supplied via the basilar well perfused to their  distal aspects.  Venous sinuses: Patent.  Anatomic variants: None significant.  Review of the MIP images confirms the above findings.  IMPRESSION: Negative CTA of the head and neck. No large vessel occlusion, hemodynamically significant stenosis, or other acute vascular Abnormality.  CT angio neck w or wo contrast 07/06/2019  FINDINGS: CTA NECK FINDINGS  Aortic arch: Visualized aortic arch of normal caliber with normal branch pattern. No hemodynamically significant stenosis about the origin of the great vessels. Minimal plaque seen along the undersurface of the arch itself.  Right carotid system: Right common and internal carotid arteries widely patent without stenosis, dissection or occlusion.  Left carotid system: Left common and internal carotid arteries widely patent without stenosis, dissection or occlusion.  Vertebral arteries: Both vertebral arteries arise from the subclavian arteries. No proximal subclavian artery stenosis. Vertebral arteries widely patent without stenosis, dissection or occlusion.  Skeleton: No acute osseous abnormality. No discrete or worrisome osseous lesions. Mild multilevel cervical spondylosis at C4-5 through C6-7 without high-grade stenosis.  Other neck: No other acute soft tissue abnormality within the neck. No mass or adenopathy.  Upper chest: Visualized upper chest demonstrates no acute finding.  IMPRESSION: Negative CTA of the head and neck. No large vessel occlusion, hemodynamically significant stenosis, or other acute vascular abnormality.  Echocardiogram 07/07/2019  IMPRESSIONS   1. Left ventricular ejection fraction, by estimation, is 60 to 65%. The  left ventricle has normal function. The left ventricle has no regional  wall motion abnormalities. There is mild left ventricular hypertrophy of  the basal-septal segment. Left  ventricular diastolic parameters are consistent with Grade I diastolic  dysfunction  (impaired relaxation).  2. Right ventricular systolic function is normal. The right ventricular  size is normal. Tricuspid regurgitation signal is inadequate for assessing  PA pressure.  3. Left atrial size was mildly dilated.  4. The mitral valve is normal in structure. No evidence of mitral valve  regurgitation. No evidence of mitral stenosis.  5. The aortic valve is normal in structure. Aortic valve regurgitation is  not visualized. No aortic stenosis is present.  6. There is mild dilatation of the ascending aorta, measuring 38 mm.  7. The inferior vena cava is dilated in size with <50% respiratory  variability, suggesting right atrial pressure of 15 mmHg.    PHYSICAL EXAM  General: Well developed, well nourished middle-age male lying comfortably in bed, NAD Cardiovascular: RRR, S1, S2 heard, no M/R/G Pulmonary: Effort normal, non labored breathing GI: Soft, not distended, no guarding, no tenderness Psych: Normal mood and affect Neurological:  Mental Status: Alert, oriented, thought content appropriate.  Speech fluent without evidence of aphasia.  Able to follow 3 step commands without difficulty. Cranial Nerves: II:  Visual fields grossly normal, pupils equal, round, reactive to light and accommodation III,IV, VI: ptosis not present, extra-ocular motions intact bilaterally V,VII: smile symmetric, facial light touch sensation mildly diminished on right, normal on left. VIII: hearing normal  bilaterally IX,X: uvula rises symmetrically XI: bilateral shoulder shrug XII: midline tongue extension without atrophy or fasciculations  Motor: Right : Upper extremity   5/5    Left:     Upper extremity   5/5  Lower extremity   5/5     Lower extremity   5/5 Tone and bulk:normal tone throughout; no atrophy noted Sensory: Pinprick and light touch mildly diminished on right, normal on left. Deep Tendon Reflexes:  Right: Upper Extremity   Left: Upper extremity   biceps (C-5 to C-6)  2/4   biceps (C-5 to C-6) 2/4 tricep (C7) 2/4    triceps (C7) 2/4 Brachioradialis (C6) 2/4  Brachioradialis (C6) 2/4  Lower Extremity Lower Extremity  quadriceps (L-2 to L-4) 2/4   quadriceps (L-2 to L-4) 2/4 Achilles (S1) 2/4   Achilles (S1) 2/4  Plantars: Right: downgoing   Left: downgoing Cerebellar: normal finger-to-nose,  normal heel-to-shin test Gait: Deferred  ASSESSMENT/PLAN Mr. ALVAN ERSTAD is a 60 y.o. male with PMHx of hypertension, tobacco use, hyperlipidemia presented with numbness and difficulty ambulating starting on 07/05/2019. MRI brain shows small acute left thalamic infarct. tPA not given as outside the window.   Stroke: Small acute left thalamic infarct, source possible small vessel disease.   CT head shows no acute abnormality.   MRI Brain: shows small acute left thalamic infarct  CTA head & neck: No large vessel occlusion,hemodynamically significant stenosis, or other acute vascular abnormality.  2D Echo: Left ventricular ejection fraction, by estimation, is 60 to 65%.  LDL 131  HgbA1c 5.5  VTE prophylaxis - SCD's  No antithrombotic prior to admission, now on aspirin 81 mg daily and clopidogrel 75 mg daily DAPT for 3 weeks and then ASA alone.   Therapy recommendations:  none  Disposition:  Home today  Hypertension  Home meds: Lisinopril-HCTZ 20-12.5, Verapamil 240 mg  Stable . Gradually normalize in 2-3 days . Resumed home meds . Long-term BP goal normotensive . Pt follow up with PCP tomorrow.  Hyperlipidemia  Home meds:  Fenofibrate 160 mg  LDL 131, goal < 70  Add lipitor 80  Continue statin at discharge  Tobacco Use disorder  Patient is an active smoker  Motivated to quit, on Nicotine patch 21 mg now  Counseled again as this is one of the modifiable risk factor for stroke.   Other Stroke Risk Factors  Counseled to stop ETOH use.  Obesity, Body mass index is 32.78 kg/m., BMI >/= 30 associated with increased stroke risk,  recommend weight loss, diet and exercise as appropriate   Family hx stroke (mother)   Honor Junes, MD PGY-1   Hospital day # 1  ATTENDING NOTE: I reviewed above note and agree with the assessment and plan. Pt was seen and examined.   60 year old male with history of hypertension, hyperlipidemia, smoker admitted for right-sided numbness.  CT, CTA head and neck negative.  MRI showed left small thalamic infarct.  EF 60 to 65%.  LDL 131 and A1c 5.5.  UDS negative.  Creatinine 1.12.  On exam, patient neurologically intact except right facial and right hand and lateral thigh decreased sensation with tingling.  Consistent with left thalamic location.  Recommend aspirin 81 and the Plavix 75 DAPT for 3 weeks and then aspirin alone.  Added statin for better hyperlipidemia control.  Okay to continue fenofibrate as home medication.  Etiology for patient so likely small vessel disease in the setting of stroke risk factors with hypertension, hyperlipidemia and smoking.  Patient was  counseled on stroke risk factor modification.  Currently BP still on the higher end, resume home medication.  Patient has appointment with her PCP tomorrow.  Long-term BP goal normotensive.  Smoke cessation education provided.  Neurology will sign off. Please call with questions. Pt will follow up with stroke clinic NP at [facility] in about 4 weeks. Thanks for the consult.  Rosalin Hawking, MD PhD Stroke Neurology 07/06/2020 4:20 PM     To contact Stroke Continuity provider, please refer to [email or web address] After hours, contact General Neurology

## 2020-07-06 NOTE — Discharge Summary (Signed)
Physician Discharge Summary  [name] [id number] DOB: 1961/06/25 DOA: 07/05/2020  PCP: Denita Lung, MD  Admit date: 07/05/2020 Discharge date: 07/06/2020  Time spent: 45 minutes  Recommendations for Outpatient Follow-up:  Patient will be discharged to home.  Patient will need to follow up with primary care provider within one week of discharge. Follow-up with neurology. Patient should continue medications as prescribed.  Patient should follow a heart healthy diet.   Discharge Diagnoses:  Active Problems:   Hypertension   Hyperlipidemia with target LDL less than 130   Stroke ([place])   Tobacco abuse   Discharge Condition: Stable  Diet recommendation: Heart healthy  Filed Weights   07/05/20 1144  Weight: 106.6 kg    History of present illness:  On 07/05/2020 by Dr. Toy Baker  Jason Robbins is a 60 y.o. male with medical history significant of HTN, tobacco abuse, HLD  Presented with numbness and tingling of right side of his body and difficulty with ambulating started at 10 PM last night no slurred speech, no weakness, no prior history of CVA Described as pins-and-needles sensation like he got numbed by dentist.  Also noted some on his right face.  No associated headache no chest pain or shortness of breath. His blood pressure been running a bit high. He is not on blood thinners. No recent fevers or chills. He continues to smoke but been trying to quit  Hospital Course:  Acute CVA -Patient presented with right-sided numbness -CT head showed no acute finding. -MRI brain small acute left thalamic infarct -CTA head and neck showed no large vessel occlusion, hemodynamically significant stenosis or other acute vascular abnormality. -Echocardiogram showed an EF of 60 to [id number], grade 1 diastolic dysfunction. No evidence of mitral regurgitation no mitral stenosis. Mild dilatation of ascending aorta 38 mm. -LDL 108, hemoglobin A1c 5.5 -Placed on statin -Neurology  consulted and appreciated, recommending aspirin and Plavix for 3 weeks followed by aspirin alone -PT evaluated patient, no further needs  Essential hypertension -BP currently -Allowed for permissive hypertension given acute CVA -Resume home medications on discharge and follow-up with PCP  Hyperlipidemia -Lipid panel: TC 177, HDL 33, LDL 108, TG 181 -Patient was on fenofibrate prior to admission -Continue statin on discharge  -Repeat LFTs and lipid panel in 3 months  Tobacco abuse -Discussed smoking cessation -Continue nicotine patch  Procedures: Echocardiogram  Consultations: Neurology   Discharge Exam: Vitals:   07/06/20 0803 07/06/20 1015  BP: (!) 170/83 139/60  Pulse: 65 74  Resp: 16 19  Temp: 98.3 F (36.8 C) 98 F (36.7 C)  SpO2: 99% 98%     General: Well developed, well nourished, NAD, appears stated age  HEENT: NCAT, , mucous membranes moist.  Cardiovascular: S1 S2 auscultated, RRR, no murmur.  Respiratory: Clear to auscultation bilaterally with equal chest rise  Abdomen: Soft, nontender, nondistended, + bowel sounds  Extremities: warm dry without cyanosis clubbing or edema  Neuro: AAOx3, slightly diminished sensation on the right side of the face. Gait normal  Skin: Without rashes exudates or nodules  Psych: Appropriate mood and affect, pleasant   Discharge Instructions Discharge Instructions    Ambulatory referral to Neurology   Complete by: As directed    An appointment is requested in approximately: 4weeks   Diet - low sodium heart healthy   Complete by: As directed    Discharge instructions   Complete by: As directed    Patient will be discharged to home.  Patient will need to  follow up with primary care provider within one week of discharge. Follow-up with neurology. Patient should continue medications as prescribed.  Patient should follow a heart healthy diet.   Increase activity slowly   Complete by: As directed      Allergies as of  07/06/2020      Reactions   Sulfa Antibiotics Swelling      Medication List    STOP taking these medications   meloxicam 15 MG tablet Commonly known as: MOBIC     TAKE these medications   ADVIL ALLERGY SINUS PO Take 1 tablet by mouth as needed (congestion/pain).   aspirin 81 MG chewable tablet Chew 1 tablet (81 mg total) by mouth daily. Start taking on: July 07, 2020   atorvastatin 80 MG tablet Commonly known as: LIPITOR Take 1 tablet (80 mg total) by mouth daily. Start taking on: July 07, 2020   clopidogrel 75 MG tablet Commonly known as: PLAVIX Take 1 tablet (75 mg total) by mouth daily. Start taking on: July 07, 2020   fenofibrate 160 MG tablet TAKE 1 TABLET BY MOUTH EVERY DAY   lisinopril-hydrochlorothiazide 20-12.5 MG tablet Commonly known as: ZESTORETIC TAKE 1 TABLET BY MOUTH EVERY DAY   nicotine 21 mg/24hr patch Commonly known as: NICODERM CQ - dosed in mg/24 hours Place 1 patch (21 mg total) onto the skin daily. Start taking on: July 07, 2020   sodium chloride 0.65 % Soln nasal spray Commonly known as: OCEAN Place 1 spray into both nostrils as needed for congestion.   triamcinolone 0.1 % Commonly known as: KENALOG Apply 1 application topically 4 (four) times daily as needed (itchy rash).   verapamil 240 MG 24 hr capsule Commonly known as: VERELAN PM TAKE 1 CAPSULE (240 MG TOTAL) BY MOUTH AT BEDTIME.      Allergies  Allergen Reactions  . Sulfa Antibiotics Swelling    Follow-up Information    Denita Lung, MD. Schedule an appointment as soon as possible for a visit in 1 week(s).   Specialty: Family Medicine Why: Hospital follow up Contact information: [place] [phone]        [facility]. Schedule an appointment as soon as possible for a visit in 3 week(s).   Specialty: Neurology Why: Stroke clinic Contact information: [address] [place] [phone]               The results of significant diagnostics from this hospitalization (including imaging, microbiology, ancillary and laboratory) are listed below for reference.    Significant Diagnostic Studies: CT ANGIO HEAD W OR WO CONTRAST  Result Date: 07/05/2020 CLINICAL DATA:  Initial evaluation for acute neuro deficit, stroke suspected. Right-sided numbness and tingling. EXAM: CT ANGIOGRAPHY HEAD AND NECK TECHNIQUE: Multidetector CT imaging of the head and neck was performed using the standard protocol during bolus administration of intravenous contrast. Multiplanar CT image reconstructions and MIPs were obtained to evaluate the vascular anatomy. Carotid stenosis measurements (when applicable) are obtained utilizing NASCET criteria, using the distal internal carotid diameter as the denominator. CONTRAST:  75mL OMNIPAQUE IOHEXOL 350 MG/ML SOLN COMPARISON:  Prior head CT and MRI from earlier the same day. FINDINGS: CTA NECK FINDINGS Aortic arch: Visualized aortic arch of normal caliber with normal branch pattern. No hemodynamically significant stenosis about the origin of the great vessels. Minimal plaque seen along the undersurface of the arch itself. Right carotid system: Right common and internal carotid arteries widely patent without stenosis,  dissection or occlusion. Left carotid system: Left common and internal carotid arteries widely patent without stenosis, dissection or occlusion. Vertebral arteries: Both vertebral arteries arise from the subclavian arteries. No proximal subclavian artery stenosis. Vertebral arteries widely patent without stenosis, dissection or occlusion. Skeleton: No acute osseous abnormality. No discrete or worrisome osseous lesions. Mild multilevel cervical spondylosis at C4-5 through C6-7 without high-grade stenosis. Other neck: No other acute soft tissue abnormality within the neck. No mass or adenopathy. Upper chest: Visualized upper chest  demonstrates no acute finding. Review of the MIP images confirms the above findings CTA HEAD FINDINGS Anterior circulation: Both internal carotid arteries widely patent to the termini without stenosis. Minimal atheromatous plaque noted within the carotid siphons. 2 mm outpouching arising from the supraclinoid right ICA favored to reflect a small vascular infundibulum related to a hypoplastic right posterior communicating artery. A1 segments widely patent. Normal anterior communicating artery complex. Anterior cerebral arteries patent to their distal aspects without stenosis. No M1 stenosis or occlusion. Left M1 bifurcates early. Distal MCA branches well perfused and symmetric. Posterior circulation: Both V4 segments widely patent to the vertebrobasilar junction without stenosis. Neither PICA origin well visualized. Basilar widely patent to its distal aspect without stenosis. Superior cerebellar arteries patent bilaterally. Both PCAs primarily supplied via the basilar well perfused to their distal aspects. Venous sinuses: Patent. Anatomic variants: None significant. Review of the MIP images confirms the above findings IMPRESSION: Negative CTA of the head and neck. No large vessel occlusion, hemodynamically significant stenosis, or other acute vascular abnormality. Electronically Signed   By: Jeannine Boga M.D.   On: 07/05/2020 22:37   DG Chest 1 View  Result Date: 07/05/2020 CLINICAL DATA:  Screening for MRI EXAM: CHEST  1 VIEW COMPARISON:  10/24/2013 FINDINGS: The heart size and mediastinal contours are within normal limits. Both lungs are clear. The visualized skeletal structures are unremarkable. No radiopaque foreign bodies. IMPRESSION: No active disease. Electronically Signed   By: Rolm Baptise M.D.   On: 07/05/2020 19:22   CT HEAD WO CONTRAST  Result Date: 07/05/2020 CLINICAL DATA:  Right-sided body numbness. EXAM: CT HEAD WITHOUT CONTRAST TECHNIQUE: Contiguous axial images were obtained from the  base of the skull through the vertex without intravenous contrast. COMPARISON:  None. FINDINGS: Brain: Brain does not show accelerated atrophy. 1 cm low-density at the base of the brain on the right probably represents a dilated perivascular space. No evidence focal infarction, mass lesion, hemorrhage, hydrocephalus or extra-axial collection. Vascular: There is atherosclerotic calcification of the major vessels at the base of the brain. Skull: Negative Sinuses/Orbits: Clear. Previous functional endoscopic sinus surgery. Orbits negative. Other: None IMPRESSION: No acute finding by CT. 1 cm low-density at the base of the brain on the right probably represents a dilated perivascular space. Electronically Signed   By: Nelson Chimes M.D.   On: 07/05/2020 12:32   CT ANGIO NECK W OR WO CONTRAST  Result Date: 07/05/2020 CLINICAL DATA:  Initial evaluation for acute neuro deficit, stroke suspected. Right-sided numbness and tingling. EXAM: CT ANGIOGRAPHY HEAD AND NECK TECHNIQUE: Multidetector CT imaging of the head and neck was performed using the standard protocol during bolus administration of intravenous contrast. Multiplanar CT image reconstructions and MIPs were obtained to evaluate the vascular anatomy. Carotid stenosis measurements (when applicable) are obtained utilizing NASCET criteria, using the distal internal carotid diameter as the denominator. CONTRAST:  59mL OMNIPAQUE IOHEXOL 350 MG/ML SOLN COMPARISON:  Prior head CT and MRI from earlier the same day. FINDINGS: CTA NECK  FINDINGS Aortic arch: Visualized aortic arch of normal caliber with normal branch pattern. No hemodynamically significant stenosis about the origin of the great vessels. Minimal plaque seen along the undersurface of the arch itself. Right carotid system: Right common and internal carotid arteries widely patent without stenosis, dissection or occlusion. Left carotid system: Left common and internal carotid arteries widely patent without  stenosis, dissection or occlusion. Vertebral arteries: Both vertebral arteries arise from the subclavian arteries. No proximal subclavian artery stenosis. Vertebral arteries widely patent without stenosis, dissection or occlusion. Skeleton: No acute osseous abnormality. No discrete or worrisome osseous lesions. Mild multilevel cervical spondylosis at C4-5 through C6-7 without high-grade stenosis. Other neck: No other acute soft tissue abnormality within the neck. No mass or adenopathy. Upper chest: Visualized upper chest demonstrates no acute finding. Review of the MIP images confirms the above findings CTA HEAD FINDINGS Anterior circulation: Both internal carotid arteries widely patent to the termini without stenosis. Minimal atheromatous plaque noted within the carotid siphons. 2 mm outpouching arising from the supraclinoid right ICA favored to reflect a small vascular infundibulum related to a hypoplastic right posterior communicating artery. A1 segments widely patent. Normal anterior communicating artery complex. Anterior cerebral arteries patent to their distal aspects without stenosis. No M1 stenosis or occlusion. Left M1 bifurcates early. Distal MCA branches well perfused and symmetric. Posterior circulation: Both V4 segments widely patent to the vertebrobasilar junction without stenosis. Neither PICA origin well visualized. Basilar widely patent to its distal aspect without stenosis. Superior cerebellar arteries patent bilaterally. Both PCAs primarily supplied via the basilar well perfused to their distal aspects. Venous sinuses: Patent. Anatomic variants: None significant. Review of the MIP images confirms the above findings IMPRESSION: Negative CTA of the head and neck. No large vessel occlusion, hemodynamically significant stenosis, or other acute vascular abnormality. Electronically Signed   By: Jeannine Boga M.D.   On: 07/05/2020 22:37   MR Brain W and Wo Contrast  Result Date:  07/05/2020 CLINICAL DATA:  Right-sided numbness EXAM: MRI HEAD WITHOUT AND WITH CONTRAST TECHNIQUE: Multiplanar, multiecho pulse sequences of the brain and surrounding structures were obtained without and with intravenous contrast. CONTRAST:  25mL GADAVIST GADOBUTROL 1 MMOL/ML IV SOLN COMPARISON:  None. FINDINGS: Brain: Subcentimeter focus of restricted diffusion is present within the left thalamus. Punctate focus of susceptibility within the left thalamus separate from above likely reflects chronic microhemorrhage. Patchy foci of T2 hyperintensity in the supratentorial white matter are nonspecific but probably reflect mild chronic microvascular ischemic changes. Small chronic left caudate head infarct. Ventricles and sulci are within normal limits in size and configuration. There is no intracranial mass or mass effect. There is no hydrocephalus or extra-axial fluid collection. No abnormal enhancement. Vascular: Major vessel flow voids at the skull base are preserved. Skull and upper cervical spine: Normal marrow signal is preserved. Sinuses/Orbits: Mild mucosal thickening.  Orbits are unremarkable. Other: Sella is unremarkable.  Mastoid air cells are clear. IMPRESSION: Small acute left thalamic infarct consistent with reported history. Mild chronic microvascular ischemic changes. Small chronic left caudate infarct. Left thalamic chronic microhemorrhage likely secondary to hypertension. Electronically Signed   By: Macy Mis M.D.   On: 07/05/2020 19:23   ECHOCARDIOGRAM COMPLETE  Result Date: 07/06/2020    ECHOCARDIOGRAM REPORT   Patient Name:   Jason Robbins Date of Exam: 07/06/2020 Medical Rec #:  [id number]     Height:       71.0 in Accession #:    [id number]    Weight:  235.0 lb Date of Birth:  1961-05-15     BSA:          2.258 m Patient Age:    7 years      BP:           170/83 mmHg Patient Gender: M             HR:           66 bpm. Exam Location:  Inpatient Procedure: 2D Echo, Color Doppler and  Cardiac Doppler Indications:    Stroke i163.9  History:        Patient has no prior history of Echocardiogram examinations.                 Risk Factors:Hypertension, Dyslipidemia and Current Smoker.  Sonographer:    Raquel Sarna Senior RDCS Referring Phys: [place]  1. Left ventricular ejection fraction, by estimation, is 60 to 65%. The left ventricle has normal function. The left ventricle has no regional wall motion abnormalities. There is mild left ventricular hypertrophy of the basal-septal segment. Left ventricular diastolic parameters are consistent with Grade I diastolic dysfunction (impaired relaxation).  2. Right ventricular systolic function is normal. The right ventricular size is normal. Tricuspid regurgitation signal is inadequate for assessing PA pressure.  3. Left atrial size was mildly dilated.  4. The mitral valve is normal in structure. No evidence of mitral valve regurgitation. No evidence of mitral stenosis.  5. The aortic valve is normal in structure. Aortic valve regurgitation is not visualized. No aortic stenosis is present.  6. There is mild dilatation of the ascending aorta, measuring 38 mm.  7. The inferior vena cava is dilated in size with <50% respiratory variability, suggesting right atrial pressure of 15 mmHg. FINDINGS  Left Ventricle: Left ventricular ejection fraction, by estimation, is 60 to 65%. The left ventricle has normal function. The left ventricle has no regional wall motion abnormalities. The left ventricular internal cavity size was normal in size. There is  mild left ventricular hypertrophy of the basal-septal segment. Left ventricular diastolic parameters are consistent with Grade I diastolic dysfunction (impaired relaxation). Normal left ventricular filling pressure. Right Ventricle: The right ventricular size is normal. No increase in right ventricular wall thickness. Right ventricular systolic function is normal. Tricuspid regurgitation signal is  inadequate for assessing PA pressure. Left Atrium: Left atrial size was mildly dilated. Right Atrium: Right atrial size was normal in size. Pericardium: There is no evidence of pericardial effusion. Mitral Valve: The mitral valve is normal in structure. No evidence of mitral valve regurgitation. No evidence of mitral valve stenosis. Tricuspid Valve: The tricuspid valve is normal in structure. Tricuspid valve regurgitation is trivial. No evidence of tricuspid stenosis. Aortic Valve: The aortic valve is normal in structure. Aortic valve regurgitation is not visualized. No aortic stenosis is present. Pulmonic Valve: The pulmonic valve was normal in structure. Pulmonic valve regurgitation is not visualized. No evidence of pulmonic stenosis. Aorta: The aortic root is normal in size and structure. There is mild dilatation of the ascending aorta, measuring 38 mm. Venous: The inferior vena cava is dilated in size with less than 50% respiratory variability, suggesting right atrial pressure of 15 mmHg. IAS/Shunts: No atrial level shunt detected by color flow Doppler.  LEFT VENTRICLE PLAX 2D LVIDd:         5.00 cm  Diastology LVIDs:         3.40 cm  LV e' medial:    5.33 cm/s LV PW:  0.90 cm  LV E/e' medial:  13.8 LV IVS:        1.30 cm  LV e' lateral:   8.59 cm/s LVOT diam:     2.50 cm  LV E/e' lateral: 8.6 LV SV:         125 LV SV Index:   55 LVOT Area:     4.91 cm  RIGHT VENTRICLE RV S prime:     14.40 cm/s TAPSE (M-mode): 2.7 cm LEFT ATRIUM           Index       RIGHT ATRIUM           Index LA diam:      3.90 cm 1.73 cm/m  RA Area:     21.80 cm LA Vol (A2C): 79.3 ml 35.12 ml/m RA Volume:   69.00 ml  30.56 ml/m LA Vol (A4C): 57.0 ml 25.24 ml/m  AORTIC VALVE LVOT Vmax:   133.00 cm/s LVOT Vmean:  93.600 cm/s LVOT VTI:    0.255 m  AORTA Ao Root diam: 3.60 cm Ao Asc diam:  3.80 cm MITRAL VALVE MV Area (PHT): 2.50 cm    SHUNTS MV Decel Time: 303 msec    Systemic VTI:  0.26 m MV E velocity: 73.70 cm/s  Systemic  Diam: 2.50 cm MV A velocity: 78.50 cm/s MV E/A ratio:  0.94 Fransico Him MD Electronically signed by Fransico Him MD Signature Date/Time: 07/06/2020/10:26:28 AM    Final     Microbiology: Recent Results (from the past 240 hour(s))  SARS CORONAVIRUS 2 (TAT 6-24 HRS) Nasopharyngeal Nasopharyngeal Swab     Status: None   Collection Time: 07/05/20  5:52 PM   Specimen: Nasopharyngeal Swab  Result Value Ref Range Status   SARS Coronavirus 2 NEGATIVE NEGATIVE Final    Comment: (NOTE) SARS-CoV-2 target nucleic acids are NOT DETECTED.  The SARS-CoV-2 RNA is generally detectable in upper and lower respiratory specimens during the acute phase of infection. Negative results do not preclude SARS-CoV-2 infection, do not rule out co-infections with other pathogens, and should not be used as the sole basis for treatment or other patient management decisions. Negative results must be combined with clinical observations, patient history, and epidemiological information. The expected result is Negative.  Fact Sheet for Patients: SugarRoll.be  Fact Sheet for Healthcare Providers: https://www.woods-mathews.com/  This test is not yet approved or cleared by the [place] FDA and  has been authorized for detection and/or diagnosis of SARS-CoV-2 by FDA under an Emergency Use Authorization (EUA). This EUA will remain  in effect (meaning this test can be used) for the duration of the COVID-19 declaration under Se ction 564(b)(1) of the Act, 21 U.S.C. section 360bbb-3(b)(1), unless the authorization is terminated or revoked sooner.  Performed at [facility], [place] [address] [place], [place] [address]      Labs: Basic Metabolic Panel: Recent Labs  Lab 07/05/20 1153 07/05/20 1546  NA 140  --   K 3.9  --   CL 103  --   CO2 25  --   GLUCOSE 107*  --   BUN 13  --   CREATININE 1.12  --   CALCIUM 9.9  --   MG  --  1.9   Liver Function  Tests: Recent Labs  Lab 07/05/20 1153  AST 20  ALT 25  ALKPHOS 42  BILITOT 0.5  PROT 7.5  ALBUMIN 4.3   No results for input(s): LIPASE, AMYLASE in the last 168 hours.  No results for input(s): AMMONIA in the last 168 hours. CBC: Recent Labs  Lab 07/05/20 1153  WBC 4.6  NEUTROABS 1.7  HGB 13.8  HCT 41.7  MCV 96.5  PLT 291   Cardiac Enzymes: No results for input(s): CKTOTAL, CKMB, CKMBINDEX, TROPONINI in the last 168 hours. BNP: BNP (last 3 results) No results for input(s): BNP in the last 8760 hours.  ProBNP (last 3 results) No results for input(s): PROBNP in the last 8760 hours.  CBG: No results for input(s): GLUCAP in the last 168 hours.     Signed:  Cristal Ford  [facility] 07/06/2020, 1:26 PM

## 2020-07-06 NOTE — ED Notes (Signed)
Pt and wife given D/C papers. All questions answered during discharge, verbalized understanding. Pt and wife ambulated off unit with this RN.

## 2020-07-06 NOTE — Progress Notes (Signed)
Echocardiogram 2D Echocardiogram has been performed.  Oneal Deputy Sadrac Zeoli 07/06/2020, 9:37 AM

## 2020-07-06 NOTE — Progress Notes (Signed)
Pt was seen for progression of mobility to see how safe he is for return home.  Note strength equal and normal in BLE's, coordination WFL LE's and has normal mini mental performance.   Pt is able to walk with supervised help, and note his wife is more than able to supervise him as well.  Follow up with PCP in case his mobility changes, and otherwise will not request follow up therapy for now.  07/06/20 1300  PT Visit Information  Last PT Received On 07/06/20  Assistance Needed +1  History of Present Illness 60 yo male with onset of R side numbness was referred to PT after dx wth L thalamic infarct.  Has been up walking with no symptoms of weakness, has been having elevated BP.  PMHx:  HTN, tobacco, HLD  Precautions  Precautions Fall  Precaution Comments monitor sats and vitals  Restrictions  Weight Bearing Restrictions No  Other Position/Activity Restrictions no fall history  Home Living  Family/patient expects to be discharged to: Private residence  Living Arrangements Spouse/significant other  Available Help at Discharge Family;Available PRN/intermittently  Type of Home House  Home Access Stairs to enter  Entrance Stairs-Number of Steps 3  Entrance Stairs-Rails Can reach both  Home Layout Two level  Alternate Level Stairs-Number of Steps 13  Alternate Level Stairs-Rails Left;Can reach both;Right  Designer, jewellery  Home Equipment None  Additional Comments has a walking stick from a relative but pt is likely too tall for it  Prior Function  Level of Independence Independent  Comments independent, driving and home wihtout assist needed  Communication  Communication No difficulties  Pain Assessment  Pain Assessment No/denies pain  Cognition  Arousal/Alertness Awake/alert  Behavior During Therapy WFL for tasks assessed/performed  Overall Cognitive Status Within Functional Limits for tasks assessed  Upper Extremity Assessment  Upper Extremity Assessment Overall WFL for tasks  assessed  Lower Extremity Assessment  Lower Extremity Assessment Overall WFL for tasks assessed (LE strength and coordination WFL)  Cervical / Trunk Assessment  Cervical / Trunk Assessment Normal  Bed Mobility  Overal bed mobility Modified Independent  General bed mobility comments takes an extra moment to move but is confined on gurney  Transfers  Overall transfer level Needs assistance  Equipment used 1 person hand held assist  Transfers Sit to/from Stand  Sit to Stand Supervision  General transfer comment supervised for safety  Ambulation/Gait  Ambulation/Gait assistance Min guard  Gait Distance (Feet) 250 Feet  Assistive device 1 person hand held assist  Gait Pattern/deviations Step-through pattern;Narrow base of support;Decreased stride length  General Gait Details pt is walking with sudden stops as requested, turning with no LOB and able to take longer walk to demonstrate endurance  Gait velocity controlled  Gait velocity interpretation <1.31 ft/sec, indicative of household ambulator  Balance  Overall balance assessment Needs assistance  Sitting-balance support Feet supported  Sitting balance-Leahy Scale Good  Standing balance support No upper extremity supported  Standing balance-Leahy Scale Good  Standing balance comment good balance control with Tinetti skill to push on sternal area in standing  Single Leg Stance - Left Leg 11  Tandem Stance - Right Leg 29  General Comments  General comments (skin integrity, edema, etc.) Pt is up to walk with PT and used min guard for safety given the ED is a crowded area.  Exercises  Exercises Other exercises (BLE strength WFL)  PT - End of Session  Activity Tolerance Patient tolerated treatment well  Patient left in  bed;with call bell/phone within reach;with family/visitor present  Nurse Communication Mobility status;Other (comment) (included MD on dc planning)  PT Assessment  PT Recommendation/Assessment Patent does not need any  further PT services  PT Visit Diagnosis Difficulty in walking, not elsewhere classified (R26.2)  No Skilled PT Patient is independent with all acitivity/mobility  AM-PAC PT "6 Clicks" Mobility Outcome Measure (Version 2)  Help needed turning from your back to your side while in a flat bed without using bedrails? 4  Help needed moving from lying on your back to sitting on the side of a flat bed without using bedrails? 4  Help needed moving to and from a bed to a chair (including a wheelchair)? 4  Help needed standing up from a chair using your arms (e.g., wheelchair or bedside chair)? 4  Help needed to walk in hospital room? 3  Help needed climbing 3-5 steps with a railing?  3  6 Click Score 22  Consider Recommendation of Discharge To: Home with no services  PT Recommendation  Follow Up Recommendations No PT follow up;Other (comment) (Follow PCP to monitor for balance and strength changes if they occur)  PT equipment None recommended by PT  Acute Rehab PT Goals  Patient Stated Goal to go home today  PT Goal Formulation All assessment and education complete, DC therapy  Time For Goal Achievement 07/06/20  Potential to Achieve Goals Good  PT Time Calculation  PT Start Time (ACUTE ONLY) 1238  PT Stop Time (ACUTE ONLY) 1301  PT Time Calculation (min) (ACUTE ONLY) 23 min  PT General Charges  $$ ACUTE PT VISIT 1 Visit  PT Evaluation  $PT Eval Moderate Complexity 1 Mod  PT Treatments  $Gait Training 8-22 mins  Written Expression  Dominant Hand Right    Mee Hives, PT MS Acute Rehab Dept. Number: [place] and [place]

## 2020-07-08 ENCOUNTER — Telehealth: Payer: Self-pay

## 2020-07-08 NOTE — Telephone Encounter (Signed)
Called pt. Per TOC report to schedule a hospital f/u for CVA. He has been scheduled on 07/10/20 at 3:30p.m. and his medications were gone over and reconciled.

## 2020-07-10 ENCOUNTER — Other Ambulatory Visit: Payer: Self-pay

## 2020-07-10 ENCOUNTER — Inpatient Hospital Stay: Payer: Self-pay | Admitting: Family Medicine

## 2020-07-10 ENCOUNTER — Encounter: Payer: Self-pay | Admitting: Family Medicine

## 2020-07-10 ENCOUNTER — Telehealth (INDEPENDENT_AMBULATORY_CARE_PROVIDER_SITE_OTHER): Payer: BC Managed Care – PPO | Admitting: Family Medicine

## 2020-07-10 VITALS — Temp 98.5°F | Wt 235.0 lb

## 2020-07-10 DIAGNOSIS — I10 Essential (primary) hypertension: Secondary | ICD-10-CM | POA: Diagnosis not present

## 2020-07-10 DIAGNOSIS — I639 Cerebral infarction, unspecified: Secondary | ICD-10-CM

## 2020-07-10 DIAGNOSIS — Z87891 Personal history of nicotine dependence: Secondary | ICD-10-CM

## 2020-07-10 NOTE — Progress Notes (Signed)
   Subjective:    Patient ID: Jason Robbins, male    DOB: October 01, 1960, 60 y.o.   MRN: [id number]  HPI I connected with  Jason Robbins on 07/10/20 by a video enabled telemedicine application and verified that I am speaking with the correct person using two identifiers.  Caregility used.  I am at home.  He is at her house. I discussed the limitations of evaluation and management by telemedicine. The patient expressed understanding and agreed to proceed. This is a posthospitalization follow-up.  He was admitted on January 9 for evaluation of right-sided tingling.  The work-up showed evidence of a thalamic stroke as well as atherosclerotic changes.  Since the left the hospital he has now quit smoking.  Apparently there was difficulty with blood pressure during the hospitalization.  He verbalized concerns over the care that he got in the hospital mainly due to Jane and care because of that.  He still complains of pins-and-needles sensation but otherwise he is functionally okay.  PT and OT did sign off on him.  He is scheduled to see neurology in approximately 1 month.  He has been driving.  Presently he is on aspirin, Plavix and he was also placed on Lipitor.  He does have a blood pressure cuff at home.  Review of Systems     Objective:   Physical Exam Alert and in no distress.  His speech pattern is normal.  The medical record including x-rays, CT and MRI as well as blood work was reviewed.       Assessment & Plan:  Essential hypertension  Quit smoking  Acute CVA (cerebrovascular accident) ([place]) I complemented him on the fact that he did quit smoking.  He will continue on his aspirin and Plavix.  Recommend he discuss this further with neurology.  Also recommend that he discuss continuing driving with neurology. He will continue on Lipitor.  He is to return here in about 2 weeks to bring his blood pressure cuff and possibly readjust his blood pressure medications.  He was comfortable with all  this. 35 minutes spent discussing all these issues with him.

## 2020-08-03 ENCOUNTER — Encounter: Payer: Self-pay | Admitting: Family Medicine

## 2020-08-04 ENCOUNTER — Encounter: Payer: Self-pay | Admitting: Adult Health

## 2020-08-04 ENCOUNTER — Ambulatory Visit (INDEPENDENT_AMBULATORY_CARE_PROVIDER_SITE_OTHER): Payer: BC Managed Care – PPO | Admitting: Adult Health

## 2020-08-04 ENCOUNTER — Other Ambulatory Visit: Payer: BC Managed Care – PPO

## 2020-08-04 VITALS — BP 178/86 | HR 76 | Ht 71.0 in | Wt 244.2 lb

## 2020-08-04 DIAGNOSIS — I6381 Other cerebral infarction due to occlusion or stenosis of small artery: Secondary | ICD-10-CM

## 2020-08-04 DIAGNOSIS — I639 Cerebral infarction, unspecified: Secondary | ICD-10-CM | POA: Diagnosis not present

## 2020-08-04 MED ORDER — ATORVASTATIN CALCIUM 80 MG PO TABS: 80.0000 mg | ORAL_TABLET | Freq: Every day | ORAL | 3 refills | Status: DC

## 2020-08-04 NOTE — Patient Instructions (Addendum)
Continue aspirin 81 mg daily  and atorvastatin 80mg  daily  for secondary stroke prevention  Continue to follow up with PCP regarding cholesterol and blood pressure management  Maintain strict control of hypertension with blood pressure goal below 130/90 and cholesterol with LDL cholesterol (bad cholesterol) goal below 70 mg/dL.      Followup in the future with me in 4 months or call earlier if needed     Thank you for coming to see [place] at [facility]. I hope we have been able to provide you high quality care today.  You may receive a patient satisfaction survey over the next few weeks. We would appreciate your feedback and comments so that we may continue to improve ourselves and the health of our patients.

## 2020-08-04 NOTE — Progress Notes (Signed)
[facility] [address] [place]. [place] [address] [phone]       HOSPITAL FOLLOW UP NOTE  Mr. STEWART SASAKI Date of Birth:  09-Jul-1960 Medical Record Number:  [id number]   Reason for Referral:  hospital stroke follow up    SUBJECTIVE:   CHIEF COMPLAINT:  Chief Complaint  Patient presents with  . Follow-up    Patient is here for a hospital follow up (07/06/20 right sided numbness/CVA).Pt reports that he feels like he's doing ok. Does he need to stay on the lipitor? Was he supposed to continue plavix? His blood pressure has been elevated.    HPI:   Kaison Mcparland Reidis a 60 y.o.malewith medical history significant of HTN, tobacco abuse, and HLD who presented to [facility] ED on 07/05/2020 with right-sided numbness/tingling and gait impairment which started the night prior.  Personally reviewed hospitalization pertinent progress notes, lab work and imaging with summary provided.  Evaluated by Dr. Erlinda Hong with stroke work-up revealed small acute left thalamic infarct likely secondary to small vessel disease.  Recommended DAPT for 3 weeks and aspirin alone.  History of HTN elevated during admission on home dose lisinopril-hydrochlorothiazide and verapamil and advised to follow-up with PCP for tighter BP control.  History of HLD on fenofibrate with LDL 131 and added atorvastatin 80 mg daily.  Current tobacco use with smoking cessation counseling provided.  Other stroke risk factors include EtOH use, obesity and family history of stroke.  No prior history of stroke.  Evaluated by therapy and discharged home in stable condition without therapy needs.   Stroke: Small acute left thalamic infarct, source possible small vessel disease.   CT head shows no acute abnormality.   MRI Brain: shows small acute left thalamic infarct  CTA head & neck: No large vessel occlusion,hemodynamically significant stenosis, or other acute vascular abnormality.  2D Echo: Left ventricular ejection  fraction, by estimation, is 60 to 65%.  LDL 131  HgbA1c 5.5  VTE prophylaxis - SCD's  No antithrombotic prior to admission, now on aspirin 81 mg daily and clopidogrel 75 mg daily DAPT for 3 weeks and then ASA alone.   Therapy recommendations:  none  Disposition:  Home today  Today, 08/04/2020, Mr. Herbst is being seen for hospital follow up accompanied.  He does report residual intermittent right hand and forearm numbness/tingling typically worsen towards the end of the day or with increased fatigue.  He denies any residual face or leg sensory changes.  Reports initially feeling emotional after discharge.  He returned to work after 2 weeks and experienced worsening emotions for the first week but these have been slowly improving.  He has no prior history of anxiety or depression.  He has returned back to all prior activities without difficulty.  Denies new stroke/TIA symptoms.  Completed 3 weeks DAPT and remains on aspirin alone without bleeding or bruising.  He has remained on atorvastatin 80 mg daily without myalgias.  Blood pressure today 178/86 -monitors at home which has been ranging 160-170/80-90s.  He has follow-up visit with PCP to further discuss further treatment options.  Continued tobacco use but does report decreasing daily amount with goals of complete cessation.  He has been gradually improving diet and increasing exercise.  No further concerns at this time.    ROS:   14 system review of systems performed and negative with exception of those listed in HPI  PMH:  Past Medical History:  Diagnosis Date  . Allergy    RHINITIS  .  Cholelithiasis   . Dyslipidemia   . GERD (gastroesophageal reflux disease)   . Herpes labialis   . Hypertension   . Psoriasis   . Renal stone   . Smoker   . Tubular adenoma of colon 12/21/2015    PSH:  Past Surgical History:  Procedure Laterality Date  . KIDNEY STONE SURGERY    . NASAL SINUS SURGERY      Social History:  Social History    Socioeconomic History  . Marital status: Married    Spouse name: Not on file  . Number of children: Not on file  . Years of education: Not on file  . Highest education level: Not on file  Occupational History  . Not on file  Tobacco Use  . Smoking status: Current Every Day Smoker    Packs/day: 0.25    Types: Cigarettes  . Smokeless tobacco: Never Used  . Tobacco comment: 1 pack monthly; uses e-cigs inbetween; trying to quit  Vaping Use  . Vaping Use: Never used  Substance and Sexual Activity  . Alcohol use: Yes    Alcohol/week: 0.0 standard drinks    Comment: holidays  . Drug use: No  . Sexual activity: Yes  Other Topics Concern  . Not on file  Social History Narrative  . Not on file   Social Determinants of Health   Financial Resource Strain: Not on file  Food Insecurity: Not on file  Transportation Needs: Not on file  Physical Activity: Not on file  Stress: Not on file  Social Connections: Not on file  Intimate Partner Violence: Not on file    Family History:  Family History  Problem Relation Age of Onset  . Hypertension Mother   . Stroke Mother   . Cancer Mother        biliary duct cancer  . Hypertension Father   . Stroke Father   . Hypertension Sister   . Heart disease Sister   . Colon cancer Neg Hx     Medications:   Current Outpatient Medications on File Prior to Visit  Medication Sig Dispense Refill  . aspirin 81 MG chewable tablet Chew 1 tablet (81 mg total) by mouth daily. 30 tablet   . Chlorpheniramine-PSE-Ibuprofen (ADVIL ALLERGY SINUS PO) Take 1 tablet by mouth as needed (congestion/pain).    . fenofibrate 160 MG tablet TAKE 1 TABLET BY MOUTH EVERY DAY (Patient taking differently: Take 160 mg by mouth daily.) 90 tablet 0  . lisinopril-hydrochlorothiazide (ZESTORETIC) 20-12.5 MG tablet TAKE 1 TABLET BY MOUTH EVERY DAY (Patient taking differently: Take 1 tablet by mouth daily.) 90 tablet 0  . sodium chloride (OCEAN) 0.65 % SOLN nasal spray  Place 1 spray into both nostrils as needed for congestion.    . triamcinolone (KENALOG) 0.1 % Apply 1 application topically 4 (four) times daily as needed (itchy rash).    . verapamil (VERELAN PM) 240 MG 24 hr capsule TAKE 1 CAPSULE (240 MG TOTAL) BY MOUTH AT BEDTIME. 90 capsule 0   No current facility-administered medications on file prior to visit.    Allergies:   Allergies  Allergen Reactions  . Sulfa Antibiotics Swelling      OBJECTIVE:  Physical Exam  Vitals:   08/04/20 1504 08/04/20 1525  BP: (!) 192/98 (!) 178/86  Pulse: 76   Weight: 244 lb 3.2 oz (110.8 kg)   Height: 5\' 11"  (1.803 m)    Body mass index is 34.06 kg/m. No exam data present  General: well developed, well nourished,  pleasant middle-age male, seated, in no evident distress Head: head normocephalic and atraumatic.   Neck: supple with no carotid or supraclavicular bruits Cardiovascular: regular rate and rhythm, no murmurs Musculoskeletal: no deformity Skin:  no rash/petichiae Vascular:  Normal pulses all extremities   Neurologic Exam Mental Status: Awake and fully alert.   Fluent speech and language.  Oriented to place and time. Recent and remote memory intact. Attention span, concentration and fund of knowledge appropriate. Mood and affect appropriate.  Cranial Nerves: Fundoscopic exam reveals sharp disc margins. Pupils equal, briskly reactive to light. Extraocular movements full without nystagmus. Visual fields full to confrontation. Hearing intact. Facial sensation intact. Face, tongue, palate moves normally and symmetrically.  Motor: Normal bulk and tone. Normal strength in all tested extremity muscles Sensory.: intact to touch , pinprick , position and vibratory sensation.  Coordination: Rapid alternating movements normal in all extremities except very slight decreased right hand finger dexterity. Finger-to-nose and heel-to-shin performed accurately bilaterally. Gait and Station: Arises from chair  without difficulty. Stance is normal. Gait demonstrates normal stride length and balance without use of assistive device. Reflexes: 1+ and symmetric. Toes downgoing.     NIHSS  0 Modified Rankin  1      ASSESSMENT: DARTAGNAN BEAVERS is a 60 y.o. year old male presented on 07/05/2020 with right-sided numbness/tingling and gait impairment that started the night prior with stroke work-up revealing small acute left thalamic infarct likely secondary to small vessel disease. Vascular risk factors include HTN, HLD, tobacco use and EtOH use.      PLAN:  1. Left thalamic stroke:  a. Residual deficit: Intermittent right hand and forearm numbness/tingling and mood changes.  Discussed typical recovery timeframe and will likely continue to experience continued recovery.  Advised to follow-up with PCP if he continues to experience altered emotions and increased anxiety for possible need of treatment options but hopefully this will continue to improve b. Continue aspirin 81 mg daily  and atorvastatin 80 mg daily and fenofibrate for secondary stroke prevention.   c. Discussed secondary stroke prevention measures and importance of close PCP follow up for aggressive stroke risk factor management  2. HTN: BP goal <130/90.  Uncontrolled on lisinopril-hydrochlorothiazide and verapamil per PCP.  He plans on follow-up with PCP to further discuss other treatment options 3. HLD: LDL goal <70. Recent LDL 131 on fenofibrate therefore added atorvastatin 80 mg daily during recent stroke admission.  Request follow-up with PCP in the next 1 to 2 months for repeat lipid panel as well as ongoing prescribing of atorvastatin 4. Tobacco use: Discussed importance of complete tobacco cessation with patient verbalizing understanding and goal of complete cessation in the near future    Follow up in 4 months or call earlier if needed   CC:  [facility] provider: Dr. Jomarie Longs, Elyse Jarvis, MD    I spent 45 minutes of face-to-face and  non-face-to-face time with patient.  This included previsit chart review including recent hospitalization pertinent progress notes, lab work and imaging, lab review, study review, order entry, electronic health record documentation, patient education regarding recent stroke and etiology, residual deficits, importance of managing stroke risk factors and answered all other questions to patient satisfaction   Frann Rider, AGNP-BC  [facility] [address] [place], [place] [address]  Phone [phone] Fax [phone] Note: This document was prepared with digital dictation and possible smart phrase technology. Any transcriptional errors that result from this process are unintentional.

## 2020-08-04 NOTE — Progress Notes (Signed)
I agree with the above plan 

## 2020-08-07 ENCOUNTER — Encounter: Payer: Self-pay | Admitting: Adult Health

## 2020-08-10 NOTE — Telephone Encounter (Signed)
MRI did show evidence of white matter hyperintensities, prior stroke and microhemorrhage.  Even though current treatment plan would not necessarily change at this point, is this something that should be further evaluated? Do we do this or do I refer him to a different specialty?

## 2020-08-11 ENCOUNTER — Encounter: Payer: Self-pay | Admitting: Adult Health

## 2020-08-12 ENCOUNTER — Other Ambulatory Visit: Payer: Self-pay | Admitting: Family Medicine

## 2020-08-12 DIAGNOSIS — M1990 Unspecified osteoarthritis, unspecified site: Secondary | ICD-10-CM

## 2020-08-12 NOTE — Telephone Encounter (Signed)
Looks as if this med was D/C. Please advise if this is refused. [place]

## 2020-08-13 NOTE — Telephone Encounter (Signed)
Input regarding if further eval is needed or if this could have been a contributing factor to his recent stroke

## 2020-09-26 ENCOUNTER — Other Ambulatory Visit: Payer: Self-pay | Admitting: Family Medicine

## 2020-09-26 DIAGNOSIS — I10 Essential (primary) hypertension: Secondary | ICD-10-CM

## 2020-09-27 ENCOUNTER — Other Ambulatory Visit: Payer: Self-pay | Admitting: Family Medicine

## 2020-09-27 DIAGNOSIS — I10 Essential (primary) hypertension: Secondary | ICD-10-CM

## 2020-09-27 DIAGNOSIS — E785 Hyperlipidemia, unspecified: Secondary | ICD-10-CM

## 2020-09-29 ENCOUNTER — Telehealth: Payer: Self-pay | Admitting: Family Medicine

## 2020-09-29 NOTE — Telephone Encounter (Signed)
Done KH 

## 2020-09-29 NOTE — Telephone Encounter (Signed)
pt made cpe appt for dec 13 that is the soonest I could get him in, pt wanted to know if he needed to come in sooner

## 2020-10-21 ENCOUNTER — Ambulatory Visit (INDEPENDENT_AMBULATORY_CARE_PROVIDER_SITE_OTHER): Payer: BC Managed Care – PPO | Admitting: Family Medicine

## 2020-10-21 ENCOUNTER — Other Ambulatory Visit: Payer: Self-pay

## 2020-10-21 ENCOUNTER — Encounter: Payer: Self-pay | Admitting: Family Medicine

## 2020-10-21 VITALS — BP 142/86 | HR 69 | Temp 97.3°F | Ht 71.0 in | Wt 240.6 lb

## 2020-10-21 DIAGNOSIS — Z8673 Personal history of transient ischemic attack (TIA), and cerebral infarction without residual deficits: Secondary | ICD-10-CM

## 2020-10-21 DIAGNOSIS — F4321 Adjustment disorder with depressed mood: Secondary | ICD-10-CM

## 2020-10-21 DIAGNOSIS — E785 Hyperlipidemia, unspecified: Secondary | ICD-10-CM | POA: Diagnosis not present

## 2020-10-21 DIAGNOSIS — I10 Essential (primary) hypertension: Secondary | ICD-10-CM | POA: Diagnosis not present

## 2020-10-21 LAB — COMPREHENSIVE METABOLIC PANEL
ALT: 30 IU/L (ref 0–44)
AST: 17 IU/L (ref 0–40)
Albumin/Globulin Ratio: 1.9 (ref 1.2–2.2)
Albumin: 4.5 g/dL (ref 3.8–4.9)
Alkaline Phosphatase: 48 IU/L (ref 44–121)
BUN/Creatinine Ratio: 13 (ref 9–20)
BUN: 15 mg/dL (ref 6–24)
Bilirubin Total: 0.5 mg/dL (ref 0.0–1.2)
CO2: 24 mmol/L (ref 20–29)
Calcium: 9.6 mg/dL (ref 8.7–10.2)
Chloride: 102 mmol/L (ref 96–106)
Creatinine, Ser: 1.17 mg/dL (ref 0.76–1.27)
Globulin, Total: 2.4 g/dL (ref 1.5–4.5)
Glucose: 87 mg/dL (ref 65–99)
Potassium: 4 mmol/L (ref 3.5–5.2)
Sodium: 140 mmol/L (ref 134–144)
Total Protein: 6.9 g/dL (ref 6.0–8.5)
eGFR: 72 mL/min/{1.73_m2} (ref >59)

## 2020-10-21 LAB — LIPID PANEL
Chol/HDL Ratio: 4.6 ratio (ref 0.0–5.0)
Cholesterol, Total: 146 mg/dL (ref 100–199)
HDL: 32 mg/dL — ABNORMAL LOW (ref >39)
LDL Chol Calc (NIH): 98 mg/dL (ref 0–99)
Triglycerides: 82 mg/dL (ref 0–149)
VLDL Cholesterol Cal: 16 mg/dL (ref 5–40)

## 2020-10-21 LAB — CBC WITH DIFFERENTIAL/PLATELET
Basophils Absolute: 0 10*3/uL (ref 0.0–0.2)
Basos: 1 %
EOS (ABSOLUTE): 0.4 10*3/uL (ref 0.0–0.4)
Eos: 9 %
Hematocrit: 39.5 % (ref 37.5–51.0)
Hemoglobin: 13.4 g/dL (ref 13.0–17.7)
Immature Grans (Abs): 0 10*3/uL (ref 0.0–0.1)
Immature Granulocytes: 0 %
Lymphocytes Absolute: 1.9 10*3/uL (ref 0.7–3.1)
Lymphs: 44 %
MCH: 32.1 pg (ref 26.6–33.0)
MCHC: 33.9 g/dL (ref 31.5–35.7)
MCV: 95 fL (ref 79–97)
Monocytes Absolute: 0.8 10*3/uL (ref 0.1–0.9)
Monocytes: 17 %
Neutrophils Absolute: 1.2 10*3/uL — ABNORMAL LOW (ref 1.4–7.0)
Neutrophils: 29 %
Platelets: 268 10*3/uL (ref 150–450)
RBC: 4.17 x10E6/uL (ref 4.14–5.80)
RDW: 13.1 % (ref 11.6–15.4)
WBC: 4.3 10*3/uL (ref 3.4–10.8)

## 2020-10-21 MED ORDER — EDARBYCLOR 40-12.5 MG PO TABS: 1.0000 | ORAL_TABLET | Freq: Every day | ORAL | 0 refills | Status: DC

## 2020-10-21 NOTE — Progress Notes (Signed)
   Subjective:    Patient ID: Jason Robbins, male    DOB: 1961-05-28, 60 y.o.   MRN: [id number]  HPI He is here for a consult after recently having a CVA.  He was diagnosed with a thalamic CVA in January.  His symptoms are mainly right-sided and at this point he seems to have recovered very nicely from this but still having difficulty dealing with the whole concept of CVA.  His brother apparently has had a CVA and has had significant neurologic compromise because of that.  Presently he is retired but working part-time at [employer] which does help.  He was placed on Lipitor and continues on his blood pressure medications.  He does have a blood pressure cuff and did bring that with him.   Review of Systems     Objective:   Physical Exam Alert and in no distress.  Moves all extremities without weakness.  Speech pattern is normal.  Blood pressure is recorded.  His BP cuff versus ours showed a great deal of difference.       Assessment & Plan:  History of CVA (cerebrovascular accident) - Plan: CBC with Differential/Platelet, Comprehensive metabolic panel  Essential hypertension - Plan: CBC with Differential/Platelet, Comprehensive metabolic panel  Hyperlipidemia with target LDL less than 130 - Plan: Lipid panel  Grief reaction He was given samples of Edarbyclor 40/12.5.  He is to stop taking his other blood pressure medications.  He will buy a new blood pressure cuff and bring it with him in about 1 month for recheck to see how he is doing. I also referred him to [facility] behavioral health to help deal with the underlying CVA and adjusting to his new lifestyle. We will also follow-up on his lipids and possibly readjust his statin.

## 2020-10-23 ENCOUNTER — Encounter: Payer: Self-pay | Admitting: Family Medicine

## 2020-10-23 DIAGNOSIS — I10 Essential (primary) hypertension: Secondary | ICD-10-CM

## 2020-10-30 ENCOUNTER — Other Ambulatory Visit: Payer: Self-pay | Admitting: Family Medicine

## 2020-10-30 DIAGNOSIS — N529 Male erectile dysfunction, unspecified: Secondary | ICD-10-CM

## 2020-10-30 NOTE — Telephone Encounter (Signed)
[facility] is requesting to fill pt tadalafil. Please advise looks as if it was d/c at another office. [place]

## 2020-11-10 MED ORDER — EDARBYCLOR 40-12.5 MG PO TABS: 1.0000 | ORAL_TABLET | Freq: Every day | ORAL | 3 refills | Status: DC

## 2020-11-24 ENCOUNTER — Ambulatory Visit: Payer: BC Managed Care – PPO | Admitting: Family Medicine

## 2020-12-07 ENCOUNTER — Ambulatory Visit: Payer: BC Managed Care – PPO | Admitting: Adult Health

## 2020-12-27 ENCOUNTER — Other Ambulatory Visit: Payer: Self-pay | Admitting: Family Medicine

## 2020-12-27 DIAGNOSIS — I10 Essential (primary) hypertension: Secondary | ICD-10-CM

## 2020-12-27 DIAGNOSIS — E785 Hyperlipidemia, unspecified: Secondary | ICD-10-CM

## 2021-01-12 ENCOUNTER — Ambulatory Visit (INDEPENDENT_AMBULATORY_CARE_PROVIDER_SITE_OTHER): Payer: BC Managed Care – PPO | Admitting: Adult Health

## 2021-01-12 ENCOUNTER — Encounter: Payer: Self-pay | Admitting: Adult Health

## 2021-01-12 VITALS — BP 160/103 | HR 69 | Ht 71.0 in | Wt 247.0 lb

## 2021-01-12 DIAGNOSIS — I10 Essential (primary) hypertension: Secondary | ICD-10-CM

## 2021-01-12 DIAGNOSIS — E785 Hyperlipidemia, unspecified: Secondary | ICD-10-CM | POA: Diagnosis not present

## 2021-01-12 DIAGNOSIS — I639 Cerebral infarction, unspecified: Secondary | ICD-10-CM

## 2021-01-12 DIAGNOSIS — I6381 Other cerebral infarction due to occlusion or stenosis of small artery: Secondary | ICD-10-CM

## 2021-01-12 NOTE — Patient Instructions (Addendum)
Continue aspirin 81 mg daily  and atorvastatin and fenofibrate for secondary stroke prevention  Continue to follow up with PCP regarding cholesterol and blood pressure management  Maintain strict control of hypertension with blood pressure goal below 130/90 and cholesterol with LDL cholesterol (bad cholesterol) goal below 70 mg/dL.        Follow-up in 6 months or call earlier if needed   Thank you for coming to see [place] at [facility]. I hope we have been able to provide you high quality care today.  You may receive a patient satisfaction survey over the next few weeks. We would appreciate your feedback and comments so that we may continue to improve ourselves and the health of our patients.   Cholesterol Content in Foods Cholesterol is a waxy, fat-like substance that helps to carry fat in the blood. The body needs cholesterol in small amounts, but too much cholesterol can causedamage to the arteries and heart. Most people should eat less than 200 milligrams (mg) of cholesterol a day. Foods with cholesterol  Cholesterol is found in animal-based foods, such as meat, seafood, and dairy. Generally, low-fat dairy and lean meats have less cholesterol than full-fat dairy and fatty meats. The milligrams of cholesterol per serving (mg per serving) of common cholesterol-containing foods are listed below. Meat and other proteins Egg -- one large whole egg has 186 mg. Veal shank -- 4 oz has 141 mg. Lean ground [place] (93% lean) -- 4 oz has 118 mg. Fat-trimmed lamb loin -- 4 oz has 106 mg. Lean ground beef (90% lean) -- 4 oz has 100 mg. Lobster -- 3.5 oz has 90 mg. Pork loin chops -- 4 oz has 86 mg. Canned salmon -- 3.5 oz has 83 mg. Fat-trimmed beef top loin -- 4 oz has 78 mg. Frankfurter -- 1 frank (3.5 oz) has 77 mg. Crab -- 3.5 oz has 71 mg. Roasted chicken without skin, white meat -- 4 oz has 66 mg. Light bologna -- 2 oz has 45 mg. Deli-cut [place] -- 2 oz has 31  mg. Canned tuna -- 3.5 oz has 31 mg. Berniece Salines -- 1 oz has 29 mg. Oysters and mussels (raw) -- 3.5 oz has 25 mg. Mackerel -- 1 oz has 22 mg. Trout -- 1 oz has 20 mg. Pork sausage -- 1 link (1 oz) has 17 mg. Salmon -- 1 oz has 16 mg. Tilapia -- 1 oz has 14 mg. Dairy Soft-serve ice cream --  cup (4 oz) has 103 mg. Whole-milk yogurt -- 1 cup (8 oz) has 29 mg. Cheddar cheese -- 1 oz has 28 mg. American cheese -- 1 oz has 28 mg. Whole milk -- 1 cup (8 oz) has 23 mg. 2% milk -- 1 cup (8 oz) has 18 mg. Cream cheese -- 1 tablespoon (Tbsp) has 15 mg. Cottage cheese --  cup (4 oz) has 14 mg. Low-fat (1%) milk -- 1 cup (8 oz) has 10 mg. Sour cream -- 1 Tbsp has 8.5 mg. Low-fat yogurt -- 1 cup (8 oz) has 8 mg. Nonfat Greek yogurt -- 1 cup (8 oz) has 7 mg. Half-and-half cream -- 1 Tbsp has 5 mg. Fats and oils Cod liver oil -- 1 tablespoon (Tbsp) has 82 mg. Butter -- 1 Tbsp has 15 mg. Lard -- 1 Tbsp has 14 mg. Bacon grease -- 1 Tbsp has 14 mg. Mayonnaise -- 1 Tbsp has 5-10 mg. Margarine -- 1 Tbsp has 3-10 mg. Exact amounts of cholesterol in these foods may  vary depending on specificingredients and brands. Foods without cholesterol Most plant-based foods do not have cholesterol unless you combine them with a food that has cholesterol. Foods without cholesterol include: Grains and cereals. Vegetables. Fruits. Vegetable oils, such as olive, canola, and sunflower oil. Legumes, such as peas, beans, and lentils. Nuts and seeds. Egg whites. Summary The body needs cholesterol in small amounts, but too much cholesterol can cause damage to the arteries and heart. Most people should eat less than 200 milligrams (mg) of cholesterol a day. This information is not intended to replace advice given to you by your health care provider. Make sure you discuss any questions you have with your healthcare provider. Document Revised: 09/24/2019 Document Reviewed: 11/04/2019 Elsevier Patient Education  [employer].

## 2021-01-12 NOTE — Progress Notes (Signed)
I agree with the above plan 

## 2021-01-12 NOTE — Progress Notes (Signed)
[facility] [address] [place]. [place] [address] [phone]       STROKE FOLLOW UP NOTE  [name] Date of Birth:  1960-09-04 Medical Record Number:  [id number]   Reason for Referral: stroke follow up    SUBJECTIVE:   CHIEF COMPLAINT:  Chief Complaint  Patient presents with   Follow-up    RM 3 alone  PT is well and stable, still having occasional tingling/numbness on R side      HPI:   Today, 01/12/2021, [name] returns for 54-month stroke follow-up unaccompanied.  Stable from stroke standpoint without new stroke/TIA symptoms.  Reports residual fluctuating right sided numbness/tingling which has been gradually improving since prior visit.  Compliant on aspirin and atorvastatin without associated side effects.  Blood pressure today 160/103 -he routinely monitors at home with SBP 130-140 over the past 2 weeks but end of June, SBP 170s-190s. PCP changed antihypertensive regimen end of April -he has follow-up visit scheduled next week.  No further concerns at this time.    History provided for reference purposes only Initial visit 08/04/2020 JM: [name] is being seen for hospital follow up accompanied.  He does report residual intermittent right hand and forearm numbness/tingling typically worsen towards the end of the day or with increased fatigue.  He denies any residual face or leg sensory changes.  Reports initially feeling emotional after discharge.  He returned to work after 2 weeks and experienced worsening emotions for the first week but these have been slowly improving.  He has no prior history of anxiety or depression.  He has returned back to all prior activities without difficulty.  Denies new stroke/TIA symptoms.  Completed 3 weeks DAPT and remains on aspirin alone without bleeding or bruising.  He has remained on atorvastatin 80 mg daily without myalgias.  Blood pressure today 178/86 -monitors at home which has been ranging 160-170/80-90s.  He  has follow-up visit with PCP to further discuss further treatment options.  Continued tobacco use but does report decreasing daily amount with goals of complete cessation.  He has been gradually improving diet and increasing exercise.  No further concerns at this time.  Stroke admission 07/05/2020 Jason Robbins is a 60 y.o. male with medical history significant of HTN, tobacco abuse, and HLD who presented to [facility] ED on 07/05/2020 with right-sided numbness/tingling and gait impairment which started the night prior.  Personally reviewed hospitalization pertinent progress notes, lab work and imaging with summary provided.  Evaluated by Dr. Erlinda Hong with stroke work-up revealed small acute left thalamic infarct likely secondary to small vessel disease.  Recommended DAPT for 3 weeks and aspirin alone.  History of HTN elevated during admission on home dose lisinopril-hydrochlorothiazide and verapamil and advised to follow-up with PCP for tighter BP control.  History of HLD on fenofibrate with LDL 131 and added atorvastatin 80 mg daily.  Current tobacco use with smoking cessation counseling provided.  Other stroke risk factors include EtOH use, obesity and family history of stroke.  No prior history of stroke.  Evaluated by therapy and discharged home in stable condition without therapy needs.   Stroke: Small acute left thalamic infarct, source possible small vessel disease.  CT head shows no acute abnormality.  MRI Brain: shows small acute left thalamic infarct CTA head & neck: No large vessel occlusion,hemodynamically significant stenosis, or other acute vascular abnormality. 2D Echo: Left ventricular ejection fraction, by estimation, is 60 to 65%. LDL 131 HgbA1c 5.5 VTE prophylaxis - SCD's  No antithrombotic prior to admission, now on aspirin 81 mg daily and clopidogrel 75 mg daily DAPT for 3 weeks and then ASA alone.  Therapy recommendations:  none Disposition:  Home today     ROS:   14 system review of  systems performed and negative with exception of those listed in HPI  PMH:  Past Medical History:  Diagnosis Date   Allergy    RHINITIS   Cholelithiasis    Dyslipidemia    GERD (gastroesophageal reflux disease)    Herpes labialis    Hypertension    Psoriasis    Renal stone    Smoker    Tubular adenoma of colon 12/21/2015    PSH:  Past Surgical History:  Procedure Laterality Date   KIDNEY STONE SURGERY     NASAL SINUS SURGERY      Social History:  Social History   Socioeconomic History   Marital status: Married    Spouse name: Not on file   Number of children: Not on file   Years of education: Not on file   Highest education level: Not on file  Occupational History   Not on file  Tobacco Use   Smoking status: Every Day    Packs/day: 0.25    Types: Cigarettes   Smokeless tobacco: Never   Tobacco comments:    1 pack monthly; uses e-cigs inbetween; trying to quit  Vaping Use   Vaping Use: Never used  Substance and Sexual Activity   Alcohol use: Yes    Alcohol/week: 0.0 standard drinks    Comment: holidays   Drug use: No   Sexual activity: Yes  Other Topics Concern   Not on file  Social History Narrative   Not on file   Social Determinants of Health   Financial Resource Strain: Not on file  Food Insecurity: Not on file  Transportation Needs: Not on file  Physical Activity: Not on file  Stress: Not on file  Social Connections: Not on file  Intimate Partner Violence: Not on file    Family History:  Family History  Problem Relation Age of Onset   Hypertension Mother    Stroke Mother    Cancer Mother        biliary duct cancer   Hypertension Father    Stroke Father    Hypertension Sister    Heart disease Sister    Other Sister        CADASIL    Colon cancer Neg Hx     Medications:   Current Outpatient Medications on File Prior to Visit  Medication Sig Dispense Refill   aspirin 81 MG chewable tablet Chew 1 tablet (81 mg total) by mouth  daily. 30 tablet    atorvastatin (LIPITOR) 80 MG tablet Take 1 tablet (80 mg total) by mouth daily. 90 tablet 3   Azilsartan-Chlorthalidone (EDARBYCLOR) 40-12.5 MG TABS Take 1 tablet by mouth daily. 30 tablet 3   Chlorpheniramine-PSE-Ibuprofen (ADVIL ALLERGY SINUS PO) Take 1 tablet by mouth as needed (congestion/pain).     fenofibrate 160 MG tablet TAKE 1 TABLET BY MOUTH EVERY DAY 90 tablet 0   meloxicam (MOBIC) 15 MG tablet TAKE 1 TABLET BY MOUTH EVERY DAY 30 tablet 1   sodium chloride (OCEAN) 0.65 % SOLN nasal spray Place 1 spray into both nostrils as needed for congestion.     tadalafil (CIALIS) 20 MG tablet TAKE 1 TABLET BY MOUTH EVERY DAY AS NEEDED FOR ERECTILE DYSFUNCTION 10 tablet 1   triamcinolone (KENALOG) 0.1 %  Apply 1 application topically 4 (four) times daily as needed (itchy rash).     No current facility-administered medications on file prior to visit.    Allergies:   Allergies  Allergen Reactions   Sulfa Antibiotics Swelling      OBJECTIVE:  Physical Exam  Vitals:   01/12/21 1518  BP: (!) 160/103  Pulse: 69  Weight: 247 lb (112 kg)  Height: 5\' 11"  (1.803 m)    Body mass index is 34.45 kg/m. No results found.  General: well developed, well nourished,  pleasant middle-age male, seated, in no evident distress Head: head normocephalic and atraumatic.   Neck: supple with no carotid or supraclavicular bruits Cardiovascular: regular rate and rhythm, no murmurs Musculoskeletal: no deformity Skin:  no rash/petichiae Vascular:  Normal pulses all extremities   Neurologic Exam Mental Status: Awake and fully alert.   Fluent speech and language.  Oriented to place and time. Recent and remote memory intact. Attention span, concentration and fund of knowledge appropriate. Mood and affect appropriate.  Cranial Nerves: Pupils equal, briskly reactive to light. Extraocular movements full without nystagmus. Visual fields full to confrontation. Hearing intact. Facial sensation  intact. Face, tongue, palate moves normally and symmetrically.  Motor: Normal bulk and tone. Normal strength in all tested extremity muscles Sensory.: intact to touch , pinprick , position and vibratory sensation.  Coordination: Rapid alternating movements normal in all extremities. Finger-to-nose and heel-to-shin performed accurately bilaterally. Gait and Station: Arises from chair without difficulty. Stance is normal. Gait demonstrates normal stride length and balance without use of assistive device. Reflexes: 1+ and symmetric. Toes downgoing.        ASSESSMENT: Jason Robbins is a 60 y.o. year old male presented on 07/05/2020 with right-sided numbness/tingling and gait impairment that started the night prior with stroke work-up revealing small acute left thalamic infarct likely secondary to small vessel disease. Vascular risk factors include HTN, HLD, tobacco use and EtOH use.      PLAN:  Left thalamic stroke:  Residual deficit: Fluctuating right-sided numbness/tingling with gradual improvement Continue aspirin 81 mg daily  and atorvastatin 80 mg daily and fenofibrate for secondary stroke prevention.   Discussed secondary stroke prevention measures and importance of close PCP follow up for aggressive stroke risk factor management  HTN: BP goal <130/90.  Uncontrolled on Edarbyclor per PCP -advised to continue to monitor at home and follow-up with PCP next week as scheduled.  Continue to monitor at home. HLD: LDL goal <70. Recent LDL 98 (09/2020) down from 131 on fenofibrate and atorvastatin 80 mg daily -would recommend repeat lipid panel (with PCP next week - not currently fasting) and if LDL remains elevated, would recommend either switching to a different high intensity statin such as Crestor or initiating PCSK9 inhibitor.  Dietary changes discussed with educational information    Follow up in 6 months or call earlier if needed   CC:  [facility] provider: Dr. Jomarie Longs, Elyse Jarvis, MD     I spent 36 minutes of face-to-face and non-face-to-face time with patient.  This included previsit chart review, lab review, study review, order entry, electronic health record documentation, patient education regarding prior stroke and etiology as well as secondary stroke prevention measures and aggressive stroke risk factor management, residual deficits and answered all other questions to patient satisfaction   Frann Rider, AGNP-BC  [facility] [address] [place], [place] [address]  Phone [phone] Fax [phone] Note: This document was prepared with digital dictation and possible smart  Company secretary. Any transcriptional errors that result from this process are unintentional.

## 2021-01-19 ENCOUNTER — Other Ambulatory Visit: Payer: Self-pay

## 2021-01-19 ENCOUNTER — Encounter: Payer: Self-pay | Admitting: Family Medicine

## 2021-01-19 ENCOUNTER — Ambulatory Visit (INDEPENDENT_AMBULATORY_CARE_PROVIDER_SITE_OTHER): Payer: BC Managed Care – PPO | Admitting: Family Medicine

## 2021-01-19 VITALS — BP 142/82 | HR 67 | Temp 97.7°F | Ht 71.0 in | Wt 245.4 lb

## 2021-01-19 DIAGNOSIS — L309 Dermatitis, unspecified: Secondary | ICD-10-CM | POA: Diagnosis not present

## 2021-01-19 DIAGNOSIS — Z23 Encounter for immunization: Secondary | ICD-10-CM | POA: Diagnosis not present

## 2021-01-19 DIAGNOSIS — I10 Essential (primary) hypertension: Secondary | ICD-10-CM

## 2021-01-19 MED ORDER — EDARBYCLOR 40-12.5 MG PO TABS: 1.0000 | ORAL_TABLET | Freq: Every day | ORAL | 3 refills | Status: DC

## 2021-01-19 MED ORDER — TRIAMCINOLONE ACETONIDE 0.1 % EX CREA: 1.0000 "application " | TOPICAL_CREAM | Freq: Four times a day (QID) | CUTANEOUS | 2 refills | Status: DC | PRN

## 2021-01-19 NOTE — Progress Notes (Signed)
   Subjective:    Patient ID: Jason Robbins, male    DOB: 03-20-1961, 60 y.o.   MRN: [id number]  HPI He is here for recheck on his blood pressure.  He is now taking Edarbychlor but did have some initial symptoms of shortness of breath and dizziness.  He states that these have diminished almost back to normal.  He has been checking his blood pressure at home and is getting systolic numbers in the [id number] range.  He also has had 1 COVID-vaccine with J&J and is interested in a booster. He would also like a refill of his triamcinolone cream.  He uses this periodically for skin rashes.  Review of Systems     Objective:   Physical Exam Alert and in no distress.  Blood pressure is recorded.       Assessment & Plan:   Essential hypertension - Plan: Azilsartan-Chlorthalidone (EDARBYCLOR) 40-12.5 MG TABS  Immunization, viral disease - Plan: Moderna Covid-19 Booster  Eczema, unspecified type - Plan: triamcinolone cream (KENALOG) 0.1 % Discussed continuing the Edarbychlor and he is comfortable with that.  He will keep me informed concerning his possible side effects.

## 2021-01-21 ENCOUNTER — Encounter: Payer: Self-pay | Admitting: Family Medicine

## 2021-02-04 ENCOUNTER — Other Ambulatory Visit: Payer: Self-pay | Admitting: Family Medicine

## 2021-02-04 DIAGNOSIS — E785 Hyperlipidemia, unspecified: Secondary | ICD-10-CM

## 2021-02-04 DIAGNOSIS — I10 Essential (primary) hypertension: Secondary | ICD-10-CM

## 2021-03-23 ENCOUNTER — Ambulatory Visit (INDEPENDENT_AMBULATORY_CARE_PROVIDER_SITE_OTHER): Payer: BC Managed Care – PPO | Admitting: Dermatology

## 2021-03-23 ENCOUNTER — Other Ambulatory Visit: Payer: Self-pay

## 2021-03-23 ENCOUNTER — Encounter: Payer: Self-pay | Admitting: Dermatology

## 2021-03-23 DIAGNOSIS — B353 Tinea pedis: Secondary | ICD-10-CM

## 2021-03-23 DIAGNOSIS — L409 Psoriasis, unspecified: Secondary | ICD-10-CM

## 2021-03-23 LAB — POCT SKIN KOH: Skin KOH, POC: NEGATIVE

## 2021-03-23 MED ORDER — CLOBETASOL PROPIONATE 0.05 % EX OINT: TOPICAL_OINTMENT | CUTANEOUS | 11 refills | Status: DC

## 2021-04-03 ENCOUNTER — Encounter: Payer: Self-pay | Admitting: Gastroenterology

## 2021-04-05 ENCOUNTER — Other Ambulatory Visit: Payer: Self-pay | Admitting: Family Medicine

## 2021-04-05 DIAGNOSIS — E785 Hyperlipidemia, unspecified: Secondary | ICD-10-CM

## 2021-04-09 ENCOUNTER — Encounter: Payer: Self-pay | Admitting: Dermatology

## 2021-04-09 NOTE — Progress Notes (Signed)
   New Patient   Subjective  Jason Robbins is a 60 y.o. male who presents for the following: Skin Problem (? Psoriasis/ eczema- Left foot- flared x 2 months- starting to get better- tx- over the counter- psoriasis lotion & tarsum- not much help ).  Chronic rash on feet, flaring for 2 months Location:  Duration:  Quality:  Associated Signs/Symptoms: Modifying Factors:  Severity:  Timing: Context:    The following portions of the chart were reviewed this encounter and updated as appropriate:  Tobacco  Allergies  Meds  Problems  Med Hx  Surg Hx  Fam Hx      Objective  Well appearing patient in no apparent distress; mood and affect are within normal limits. Left Lateral Plantar Surface Scaling and maceration between toes and over distal and lateral soles.  KOH negative but clinically typical chronic tinea.  Left Lateral Plantar Surface None marginated psoriasiform patch compatible with acral psoriasis or hyperkeratotic eczema.    A focused examination was performed including head, neck, hands, feet, nails. Relevant physical exam findings are noted in the Assessment and Plan.   Assessment & Plan  Tinea pedis of left foot Left Lateral Plantar Surface  POCT Skin KOH - Left Lateral Plantar Surface  Culture, fungus without smear - Left Lateral Plantar Surface  Psoriasis Left Lateral Plantar Surface  Try clobetasol daily after bathing for 1 month; if no improvement, consider biopsy.  clobetasol ointment (TEMOVATE) 0.05 % - Left Lateral Plantar Surface Apply to affected area after bathing

## 2021-04-22 LAB — CULTURE, FUNGUS WITHOUT SMEAR
CULTURE:: NO GROWTH
MICRO NUMBER:: 12428129
SPECIMEN QUALITY:: ADEQUATE

## 2021-04-24 ENCOUNTER — Other Ambulatory Visit: Payer: Self-pay | Admitting: Family Medicine

## 2021-04-24 DIAGNOSIS — L309 Dermatitis, unspecified: Secondary | ICD-10-CM

## 2021-04-26 NOTE — Telephone Encounter (Signed)
[facility] is requesting to fill pt triamcinolone cream. Please advise [facility]

## 2021-05-28 ENCOUNTER — Encounter: Payer: Self-pay | Admitting: Family Medicine

## 2021-06-08 ENCOUNTER — Ambulatory Visit (INDEPENDENT_AMBULATORY_CARE_PROVIDER_SITE_OTHER): Payer: BC Managed Care – PPO | Admitting: Family Medicine

## 2021-06-08 ENCOUNTER — Encounter: Payer: Self-pay | Admitting: Family Medicine

## 2021-06-08 ENCOUNTER — Other Ambulatory Visit: Payer: Self-pay

## 2021-06-08 VITALS — BP 136/82 | HR 89 | Temp 98.1°F | Ht 71.0 in | Wt 246.8 lb

## 2021-06-08 DIAGNOSIS — Z23 Encounter for immunization: Secondary | ICD-10-CM

## 2021-06-08 DIAGNOSIS — Z125 Encounter for screening for malignant neoplasm of prostate: Secondary | ICD-10-CM

## 2021-06-08 DIAGNOSIS — Z860100 Personal history of colon polyps, unspecified: Secondary | ICD-10-CM

## 2021-06-08 DIAGNOSIS — E785 Hyperlipidemia, unspecified: Secondary | ICD-10-CM | POA: Diagnosis not present

## 2021-06-08 DIAGNOSIS — Z8673 Personal history of transient ischemic attack (TIA), and cerebral infarction without residual deficits: Secondary | ICD-10-CM

## 2021-06-08 DIAGNOSIS — Z0000 Encounter for general adult medical examination without abnormal findings: Secondary | ICD-10-CM

## 2021-06-08 DIAGNOSIS — N529 Male erectile dysfunction, unspecified: Secondary | ICD-10-CM

## 2021-06-08 DIAGNOSIS — J301 Allergic rhinitis due to pollen: Secondary | ICD-10-CM | POA: Diagnosis not present

## 2021-06-08 DIAGNOSIS — F17200 Nicotine dependence, unspecified, uncomplicated: Secondary | ICD-10-CM

## 2021-06-08 DIAGNOSIS — I10 Essential (primary) hypertension: Secondary | ICD-10-CM

## 2021-06-08 DIAGNOSIS — E291 Testicular hypofunction: Secondary | ICD-10-CM

## 2021-06-08 MED ORDER — FENOFIBRATE 160 MG PO TABS: 160.0000 mg | ORAL_TABLET | Freq: Every day | ORAL | 3 refills | Status: DC

## 2021-06-08 MED ORDER — ATORVASTATIN CALCIUM 80 MG PO TABS: 80.0000 mg | ORAL_TABLET | Freq: Every day | ORAL | 3 refills | Status: DC

## 2021-06-08 MED ORDER — EDARBYCLOR 40-12.5 MG PO TABS: 1.0000 | ORAL_TABLET | Freq: Every day | ORAL | 3 refills | Status: DC

## 2021-06-08 NOTE — Progress Notes (Signed)
° °  Subjective:    Patient ID: Jason Robbins, male    DOB: [birth year], 60 y.o.   MRN: [id number]  HPI He is here for complete examination.  He is doing well on his blood pressure medication.  He plans to make changes in his diet and exercise as he has gained weight and is interested in changing.  He does have Cialis but has yet to use it but still is having some erectile dysfunction.  He also has a previous history of hypogonadism but is presently not on testosterone.  He is really not sure why he stopped taking it.  He has a previous history of CVA but has not had any neurologic symptoms in quite some time.  He does smoke and at the present time is not interested in quitting.  He does have a history of colonic polyps and plans to have repeat colonoscopy in January.  Continues on fenofibrate as well as Lipitor and having no difficulty with that.   Review of Systems  All other systems reviewed and are negative.     Objective:   Physical Exam Alert and in no distress. Tympanic membranes and canals are normal. Pharyngeal area is normal. Neck is supple without adenopathy or thyromegaly. Cardiac exam shows a regular sinus rhythm without murmurs or gallops. Lungs are clear to auscultation.        Assessment & Plan:  Routine general medical examination at a health care facility - Plan: CBC with Differential/Platelet, Comprehensive metabolic panel, Lipid panel  Need for influenza vaccination - Plan: Flu Vaccine QUAD 42mo+IM (Fluarix, Fluzone & Alfiuria Quad PF)  Primary hypertension - Plan: CBC with Differential/Platelet, Comprehensive metabolic panel  Hyperlipidemia with target LDL less than 130 - Plan: Lipid panel, fenofibrate 160 MG tablet, atorvastatin (LIPITOR) 80 MG tablet  Current smoker  Allergic rhinitis due to pollen, unspecified seasonality  History of colonic polyps  Essential hypertension - Plan: Azilsartan-Chlorthalidone (EDARBYCLOR) 40-12.5 MG TABS  Hypogonadism in male -  Plan: Testosterone  Screening for prostate cancer - Plan: PSA  History of CVA (cerebrovascular accident)  Erectile dysfunction, unspecified erectile dysfunction type Currently he is not ready to quit smoking.  He is also not ready to have any further immunizations specifically shingles and COVID.  Discussed smoking cessation with him but at this time he is not ready to put forth the energy and effort necessary.  We will check his testosterone level and possibly starting back on it.  Discussed the use of Cialis.  Explained that all the symptoms he read about are all potential symptoms rather than renal and is worthwhile trying.  Explained how to use it and the fact that the side effects usually are transient in nature.

## 2021-06-09 LAB — CBC WITH DIFFERENTIAL/PLATELET
Basophils Absolute: 0.1 10*3/uL (ref 0.0–0.2)
Basos: 1 %
EOS (ABSOLUTE): 0.4 10*3/uL (ref 0.0–0.4)
Eos: 8 %
Hematocrit: 41.2 % (ref 37.5–51.0)
Hemoglobin: 14.2 g/dL (ref 13.0–17.7)
Immature Grans (Abs): 0 10*3/uL (ref 0.0–0.1)
Immature Granulocytes: 0 %
Lymphocytes Absolute: 2.5 10*3/uL (ref 0.7–3.1)
Lymphs: 50 %
MCH: 32.5 pg (ref 26.6–33.0)
MCHC: 34.5 g/dL (ref 31.5–35.7)
MCV: 94 fL (ref 79–97)
Monocytes Absolute: 0.6 10*3/uL (ref 0.1–0.9)
Monocytes: 12 %
Neutrophils Absolute: 1.4 10*3/uL (ref 1.4–7.0)
Neutrophils: 29 %
Platelets: 282 10*3/uL (ref 150–450)
RBC: 4.37 x10E6/uL (ref 4.14–5.80)
RDW: 12.2 % (ref 11.6–15.4)
WBC: 5 10*3/uL (ref 3.4–10.8)

## 2021-06-09 LAB — COMPREHENSIVE METABOLIC PANEL
ALT: 34 IU/L (ref 0–44)
AST: 20 IU/L (ref 0–40)
Albumin/Globulin Ratio: 2 (ref 1.2–2.2)
Albumin: 4.8 g/dL (ref 3.8–4.9)
Alkaline Phosphatase: 66 IU/L (ref 44–121)
BUN/Creatinine Ratio: 14 (ref 10–24)
BUN: 15 mg/dL (ref 8–27)
Bilirubin Total: 0.3 mg/dL (ref 0.0–1.2)
CO2: 24 mmol/L (ref 20–29)
Calcium: 10.1 mg/dL (ref 8.6–10.2)
Chloride: 100 mmol/L (ref 96–106)
Creatinine, Ser: 1.05 mg/dL (ref 0.76–1.27)
Globulin, Total: 2.4 g/dL (ref 1.5–4.5)
Glucose: 91 mg/dL (ref 70–99)
Potassium: 4.1 mmol/L (ref 3.5–5.2)
Sodium: 138 mmol/L (ref 134–144)
Total Protein: 7.2 g/dL (ref 6.0–8.5)
eGFR: 81 mL/min/{1.73_m2} (ref >59)

## 2021-06-09 LAB — LIPID PANEL
Chol/HDL Ratio: 4.5 ratio (ref 0.0–5.0)
Cholesterol, Total: 152 mg/dL (ref 100–199)
HDL: 34 mg/dL — ABNORMAL LOW (ref >39)
LDL Chol Calc (NIH): 95 mg/dL (ref 0–99)
Triglycerides: 125 mg/dL (ref 0–149)
VLDL Cholesterol Cal: 23 mg/dL (ref 5–40)

## 2021-06-09 LAB — TESTOSTERONE: Testosterone: 522 ng/dL (ref 264–916)

## 2021-06-09 LAB — PSA: Prostate Specific Ag, Serum: 0.8 ng/mL (ref 0.0–4.0)

## 2021-07-22 ENCOUNTER — Ambulatory Visit: Payer: BC Managed Care – PPO | Admitting: Adult Health

## 2021-09-04 ENCOUNTER — Encounter: Payer: Self-pay | Admitting: Family Medicine

## 2021-11-30 ENCOUNTER — Ambulatory Visit (INDEPENDENT_AMBULATORY_CARE_PROVIDER_SITE_OTHER): Payer: BC Managed Care – PPO | Admitting: Family Medicine

## 2021-11-30 ENCOUNTER — Encounter: Payer: Self-pay | Admitting: Family Medicine

## 2021-11-30 VITALS — BP 150/82 | HR 82 | Temp 97.0°F | Wt 255.8 lb

## 2021-11-30 DIAGNOSIS — L089 Local infection of the skin and subcutaneous tissue, unspecified: Secondary | ICD-10-CM

## 2021-11-30 DIAGNOSIS — L723 Sebaceous cyst: Secondary | ICD-10-CM | POA: Diagnosis not present

## 2021-11-30 NOTE — Progress Notes (Signed)
   Subjective:    Patient ID: Jason Robbins, male    DOB: Mar 05, 1961, 61 y.o.   MRN: [id number]  HPI He is here for evaluation of a lesion behind the right ear that is on 2 occasions opened and drained.  The most recent occurrence was yesterday.   Review of Systems     Objective:   Physical Exam Exam of the right posterior ear does show a healing lesion that is nontender and slightly erythematous.  It looks like there actually 2 other areas that might be's part that whole cystic lesion.       Assessment & Plan:  Infected sebaceous cyst I explained that since it is started to drain on its own, no further intervention at this point but if it reoccurs to come in before it starts to drain on its own so I can do a more effective job of removing as much of the cyst as possible.

## 2022-01-16 IMAGING — DX DG CHEST 1V
1 series · 1 of 1 positions shown · non-contrast
Comparison: 10/24/2013

CLINICAL DATA: Screening for MRI

EXAM:
CHEST  1 VIEW

[chest ap]
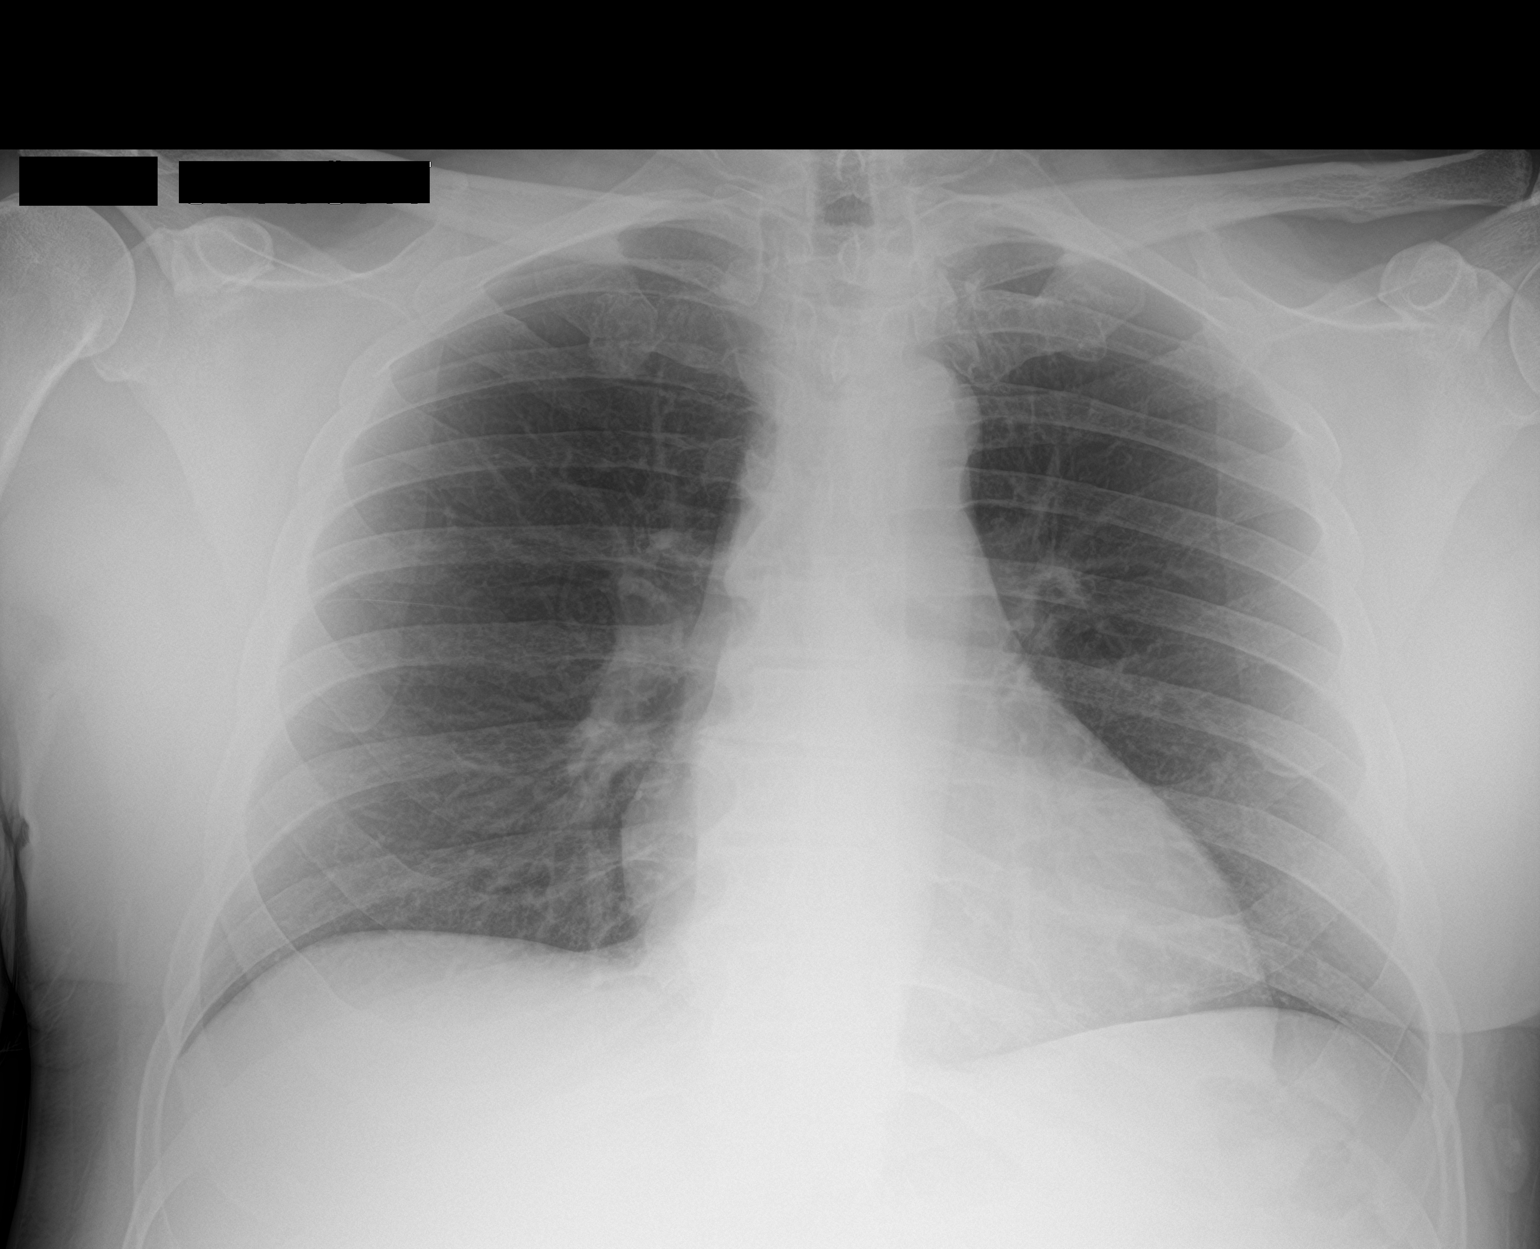

[1 of 1 positions shown; findings below may reference images not displayed]

FINDINGS: The heart size and mediastinal contours are within normal limits.
Both lungs are clear. The visualized skeletal structures are
unremarkable. No radiopaque foreign bodies.
IMPRESSION: No active disease.

## 2022-01-16 IMAGING — MR MR HEAD WO/W CM
14 of 16 series · 40 of 48 positions shown · IV contrast (gadavist)
Comparison: None.

CLINICAL DATA: Right-sided numbness

EXAM:
MRI HEAD WITHOUT AND WITH CONTRAST
TECHNIQUE: Multiplanar, multiecho pulse sequences of the brain and surrounding
structures were obtained without and with intravenous contrast.
CONTRAST:  10mL GADAVIST GADOBUTROL 1 MMOL/ML IV SOLN

[Series 5: DWI · axial · 3.0mm · 0.96mm/px · z∈[-58,+94]mm · 5 of 104 slices shown (1 of 4)]
[im 1/104]
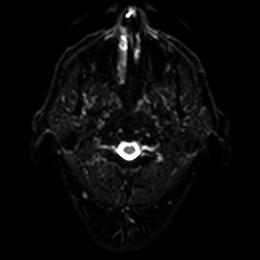
[im 26/104]
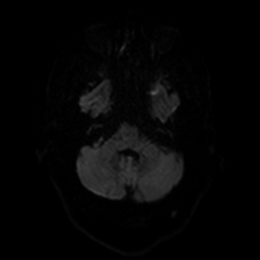
[im 52/104]
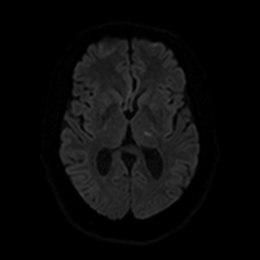
[im 78/104]
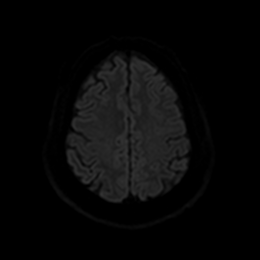
[im 104/104]
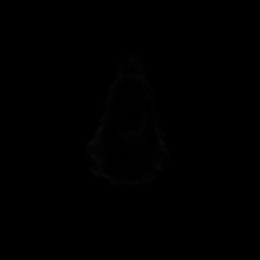

[Series 6: DWI · axial · 3.0mm · 0.96mm/px · z∈[-58,+94]mm · 2 of 52 slices shown (2 of 4)]
[im 1/52]
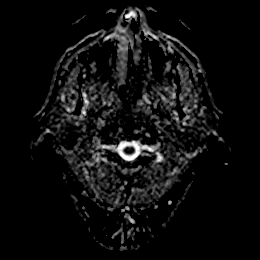
[im 52/52]
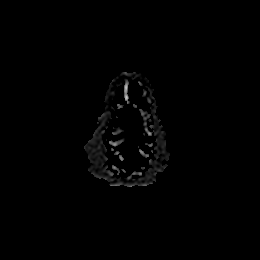

[Series 7: DWI · coronal · 4.0mm · 0.88mm/px · 4 of 76 slices shown (3 of 4)]
[im 1/76]
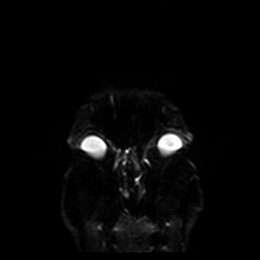
[im 26/76]
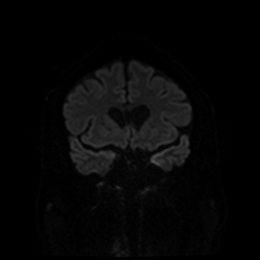
[im 51/76]
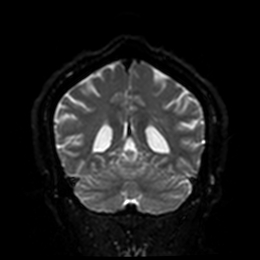
[im 76/76]
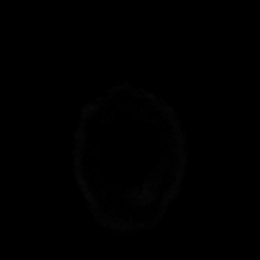

[Series 8: DWI · coronal · 4.0mm · 0.88mm/px · 2 of 38 slices shown (4 of 4)]
[im 1/38]
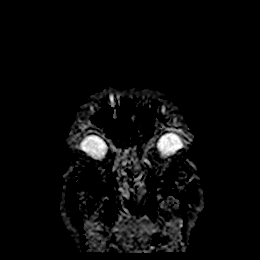
[im 38/38]
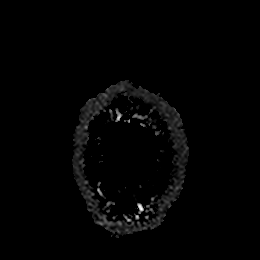

[Series 9: mag_images · axial · 3.0mm · 0.98mm/px · z∈[-69,+107]mm · 4 of 60 slices shown]
[im 1/60]
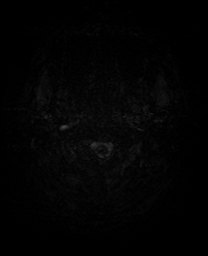
[im 20/60]
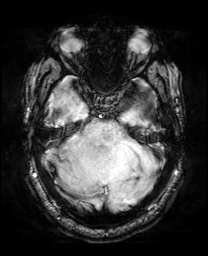
[im 40/60]
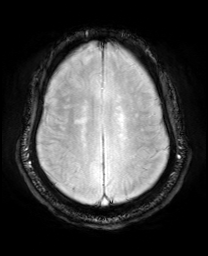
[im 60/60]
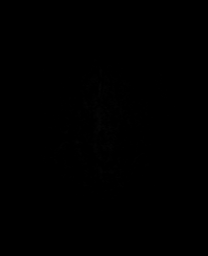

[Series 10: pha_images · axial · 3.0mm · 0.98mm/px · z∈[-69,+101]mm · 4 of 58 slices shown]
[im 1/58]
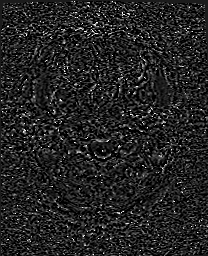
[im 20/58]
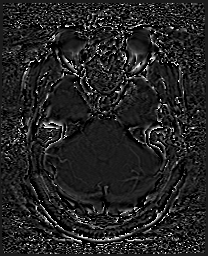
[im 39/58]
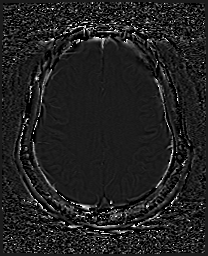
[im 58/58]
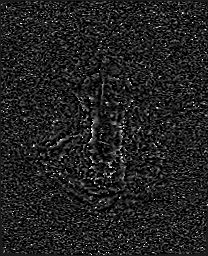

[Series 11: swi_images · axial · 3.0mm · 0.98mm/px · z∈[-69,+107]mm · 4 of 60 slices shown]
[im 1/60]
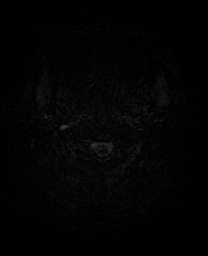
[im 20/60]
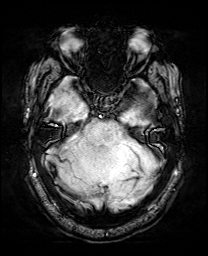
[im 40/60]
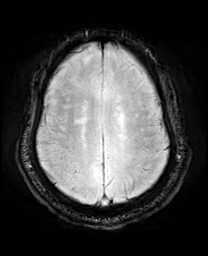
[im 60/60]
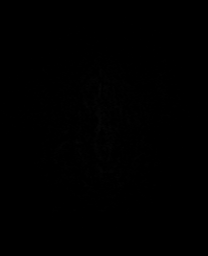

[Series 12: mip_images(sw) · axial · 24.0mm · 0.98mm/px · z∈[-58,+97]mm · 3 of 53 slices shown]
[im 1/53]
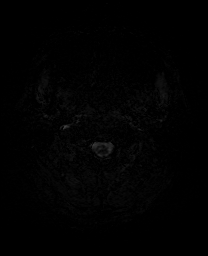
[im 27/53]
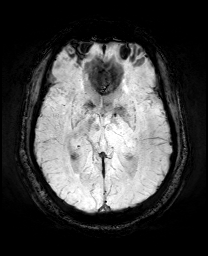
[im 53/53]
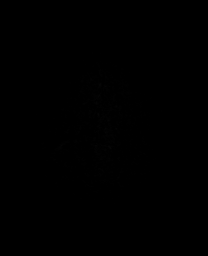

[Series 13: FLAIR · axial · 5.0mm · 0.49mm/px · z∈[-52,+91]mm · 2 of 25 slices shown]
[im 1/25]
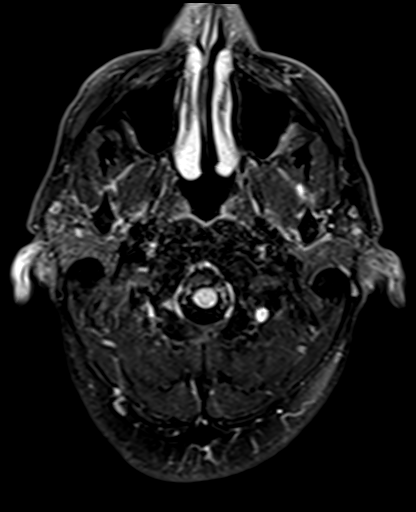
[im 25/25]
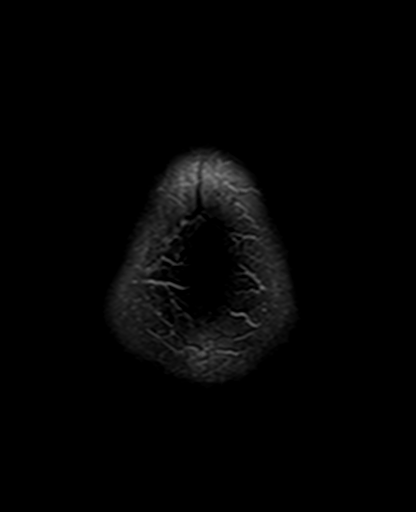

[Series 14: T1 · sagittal · 5.0mm · 0.78mm/px · 2 of 25 slices shown]
[im 1/25]
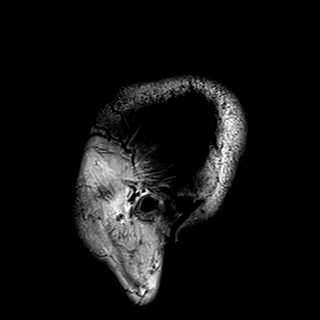
[im 25/25]
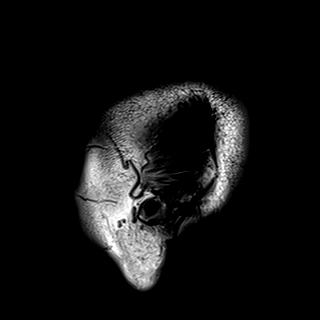

[Series 15: T2 · axial · 5.0mm · 0.78mm/px · z∈[-53,+90]mm · 2 of 25 slices shown]
[im 1/25]
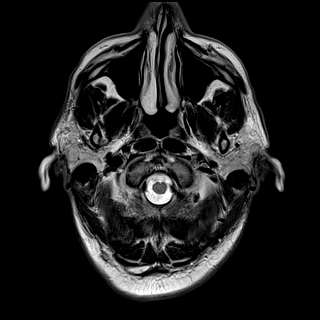
[im 25/25]
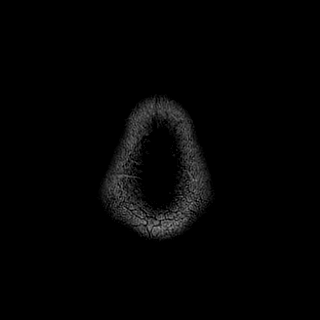

[Series 17: T2 post-contrast · coronal · 5.0mm · 0.72mm/px · 2 of 31 slices shown]
[im 1/31]
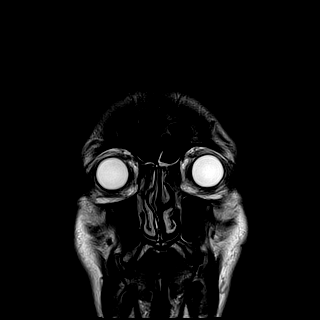
[im 31/31]
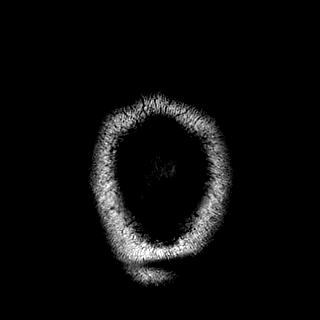

[Series 19: T1 post-contrast · sagittal · 5.0mm · 0.78mm/px · 2 of 25 slices shown (1 of 2)]
[im 1/25]
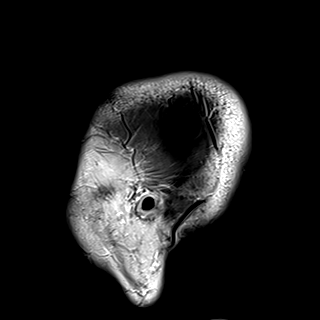
[im 25/25]
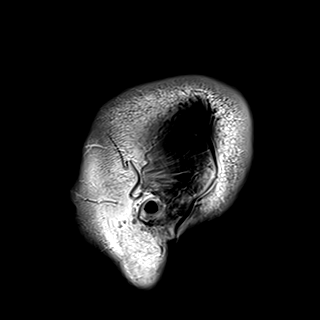

[Series 20: T1 post-contrast · coronal · 5.0mm · 0.34mm/px · 2 of 31 slices shown (2 of 2)]
[im 1/31]
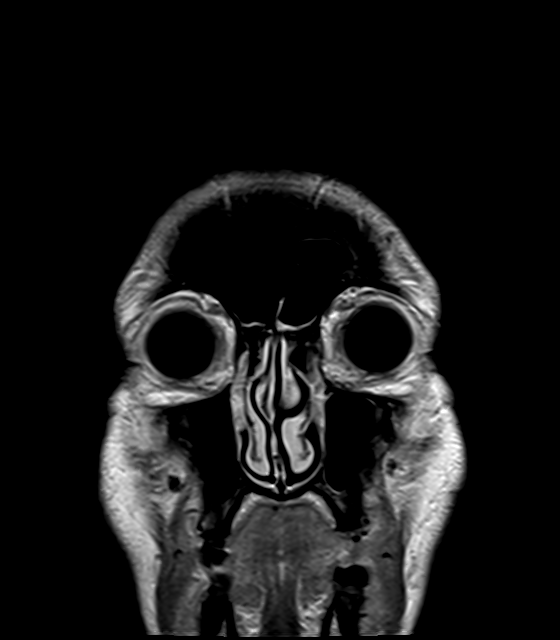
[im 31/31]
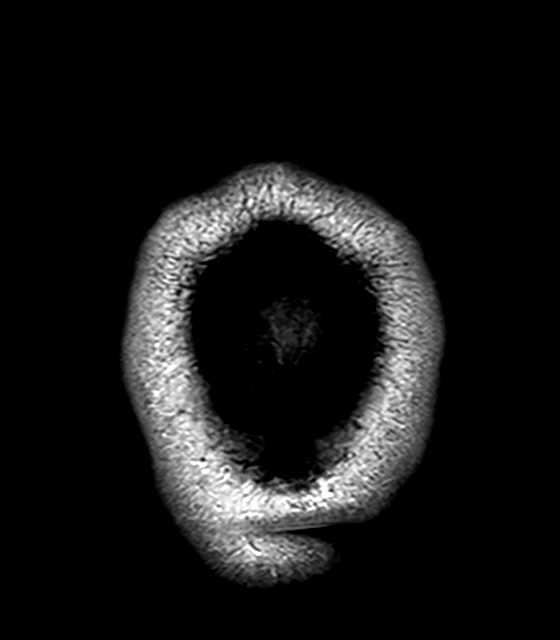

[40 of 48 positions shown; findings below may reference images not displayed]

FINDINGS: Brain: Subcentimeter focus of restricted diffusion is present within
the left thalamus.

Punctate focus of susceptibility within the left thalamus separate
from above likely reflects chronic microhemorrhage. Patchy foci of
T2 hyperintensity in the supratentorial white matter are nonspecific
but probably reflect mild chronic microvascular ischemic changes.
Small chronic left caudate head infarct. Ventricles and sulci are
within normal limits in size and configuration. There is no
intracranial mass or mass effect. There is no hydrocephalus or
extra-axial fluid collection. No abnormal enhancement.

Vascular: Major vessel flow voids at the skull base are preserved.

Skull and upper cervical spine: Normal marrow signal is preserved.

Sinuses/Orbits: Mild mucosal thickening.  Orbits are unremarkable.

Other: Sella is unremarkable.  Mastoid air cells are clear.
IMPRESSION: Small acute left thalamic infarct consistent with reported history.

Mild chronic microvascular ischemic changes. Small chronic left
caudate infarct. Left thalamic chronic microhemorrhage likely
secondary to hypertension.

## 2022-03-02 ENCOUNTER — Encounter: Payer: Self-pay | Admitting: Internal Medicine

## 2022-03-21 ENCOUNTER — Encounter: Payer: Self-pay | Admitting: Gastroenterology

## 2022-04-05 ENCOUNTER — Encounter: Payer: Self-pay | Admitting: Internal Medicine

## 2022-04-18 ENCOUNTER — Encounter: Payer: Self-pay | Admitting: Internal Medicine

## 2022-04-18 ENCOUNTER — Ambulatory Visit (AMBULATORY_SURGERY_CENTER): Payer: Self-pay

## 2022-04-18 VITALS — Ht 71.0 in | Wt 248.0 lb

## 2022-04-18 DIAGNOSIS — Z86010 Personal history of colonic polyps: Secondary | ICD-10-CM

## 2022-04-18 MED ORDER — NA SULFATE-K SULFATE-MG SULF 17.5-3.13-1.6 GM/177ML PO SOLN: 1.0000 | Freq: Once | ORAL | 0 refills | Status: AC

## 2022-04-18 NOTE — Progress Notes (Signed)

## 2022-04-22 ENCOUNTER — Other Ambulatory Visit: Payer: Self-pay | Admitting: Family Medicine

## 2022-04-22 ENCOUNTER — Encounter: Payer: Self-pay | Admitting: Gastroenterology

## 2022-04-22 DIAGNOSIS — E785 Hyperlipidemia, unspecified: Secondary | ICD-10-CM

## 2022-05-02 ENCOUNTER — Encounter: Payer: BC Managed Care – PPO | Admitting: Gastroenterology

## 2022-05-03 ENCOUNTER — Ambulatory Visit (INDEPENDENT_AMBULATORY_CARE_PROVIDER_SITE_OTHER): Payer: BC Managed Care – PPO | Admitting: Family Medicine

## 2022-05-03 ENCOUNTER — Encounter: Payer: Self-pay | Admitting: Family Medicine

## 2022-05-03 VITALS — BP 170/88 | HR 68 | Temp 97.7°F | Ht 71.0 in | Wt 250.6 lb

## 2022-05-03 DIAGNOSIS — J40 Bronchitis, not specified as acute or chronic: Secondary | ICD-10-CM | POA: Diagnosis not present

## 2022-05-03 MED ORDER — AZITHROMYCIN 500 MG PO TABS: 500.0000 mg | ORAL_TABLET | Freq: Every day | ORAL | 0 refills | Status: DC

## 2022-05-03 NOTE — Progress Notes (Signed)
   Subjective:    Patient ID: Jason Robbins, male    DOB: 02-17-1961, 61 y.o.   MRN: [id number]  HPI He states that on October 28 he developed a cough and did test for COVID which was negative.  He then retested a couple days later which was negative.  He has had malaise, sore throat, chest congestion but no earache fever or chills.  He is slowly getting better but at a very slow pace.   Review of Systems     Objective:   Physical Exam Alert and in no distress. Tympanic membranes and canals are normal. Pharyngeal area is normal. Neck is supple without adenopathy or thyromegaly. Cardiac exam shows a regular sinus rhythm without murmurs or gallops. Lungs show expiratory rhonchi.        Assessment & Plan:  Bronchitis - Plan: azithromycin (ZITHROMAX) 500 MG tablet Although he is getting better I think an antibiotic would help.  Explained the use of azithromycin.  Recommend he continue on OTC meds for his other symptoms.

## 2022-05-31 ENCOUNTER — Other Ambulatory Visit: Payer: Self-pay | Admitting: Family Medicine

## 2022-05-31 DIAGNOSIS — I10 Essential (primary) hypertension: Secondary | ICD-10-CM

## 2022-06-01 NOTE — Telephone Encounter (Signed)
Refill request last apt 05/03/22 next apt 06/14/22.

## 2022-06-14 ENCOUNTER — Encounter: Payer: BC Managed Care – PPO | Admitting: Family Medicine

## 2022-07-06 ENCOUNTER — Encounter: Payer: Self-pay | Admitting: Internal Medicine

## 2022-08-30 ENCOUNTER — Other Ambulatory Visit: Payer: BC Managed Care – PPO

## 2022-08-30 NOTE — Progress Notes (Signed)
Patient appearing on report for [facility] - Hypertension Control report due to last documented ambulatory blood pressure of 170/88 on 05/03/22. Next appointment with PCP is 09/01/22   Outreached patient to discuss hypertension control and medication management.   Current antihypertensives: Edarbyclor 40/12.'5mg'$  -Patient has an automated upper arm home BP machine, but does state he took to last OV and monitor varied from clinic-home monitor showed systolic 0000000 versus clinic.  He is interested in obtaining a new monitor. -Current blood pressure readings: 149/88 3/2- checks approximately 2x/week -Endorses recently lifestyle modifications in attempt to gain better control -Patient denies side effects related to current regiment.  States copay is expensive but affordable at this time when getting medication through [facility] order.  Just received 90 day supply in the mail.  HLD with prior CVA event 2022 -Also evaluated current lipid control - LDL 95 06/08/2021 -Due for lab work- patient states going for fasting labs Wednesday morning 3/6 -Currently taking atorvastatin '80mg'$  and ASA '81mg'$  every other day  Assessment/Plan: HTN - Currently uncontrolled - - Reviewed goal blood pressure <130/80 - Reviewed to check blood pressure 2-3x/week, document, and provide at next provider visit - Recommend increasing Edarbyclor to 40/'25mg'$  daily if remaining hypertensive - Suggested patient reach out if this medication were to no longer be affordable for him  HLD with prior CVA event 2022 -ASCVD risk factors:  prior CVA, current smoker, HTN -recommend continuing ASA '81mg'$  every other day or even daily -recommend addition of ezetimibe '10mg'$  in an attempt to get LDL <70 -follow-up lipid profile and LFT's in 3 months  Follow-up Plan:  Sending MyChart message with recommended home BP monitors.  Follow-up telephone visit scheduled in 10 weeks to check on home BP readings prior to patient running out of current  stock of [place], PharmD, DPLA

## 2022-08-31 ENCOUNTER — Other Ambulatory Visit: Payer: 59

## 2022-08-31 DIAGNOSIS — Z0000 Encounter for general adult medical examination without abnormal findings: Secondary | ICD-10-CM

## 2022-09-01 ENCOUNTER — Encounter: Payer: Self-pay | Admitting: Family Medicine

## 2022-09-01 ENCOUNTER — Ambulatory Visit: Payer: 59 | Admitting: Family Medicine

## 2022-09-01 VITALS — BP 170/94 | HR 79 | Temp 98.3°F | Resp 18 | Ht 70.25 in | Wt 243.8 lb

## 2022-09-01 DIAGNOSIS — L309 Dermatitis, unspecified: Secondary | ICD-10-CM

## 2022-09-01 DIAGNOSIS — Z0000 Encounter for general adult medical examination without abnormal findings: Secondary | ICD-10-CM | POA: Diagnosis not present

## 2022-09-01 DIAGNOSIS — Z8673 Personal history of transient ischemic attack (TIA), and cerebral infarction without residual deficits: Secondary | ICD-10-CM

## 2022-09-01 DIAGNOSIS — I10 Essential (primary) hypertension: Secondary | ICD-10-CM

## 2022-09-01 DIAGNOSIS — M1990 Unspecified osteoarthritis, unspecified site: Secondary | ICD-10-CM

## 2022-09-01 DIAGNOSIS — J301 Allergic rhinitis due to pollen: Secondary | ICD-10-CM | POA: Diagnosis not present

## 2022-09-01 DIAGNOSIS — N529 Male erectile dysfunction, unspecified: Secondary | ICD-10-CM

## 2022-09-01 DIAGNOSIS — E291 Testicular hypofunction: Secondary | ICD-10-CM

## 2022-09-01 DIAGNOSIS — E785 Hyperlipidemia, unspecified: Secondary | ICD-10-CM | POA: Diagnosis not present

## 2022-09-01 DIAGNOSIS — Z86010 Personal history of colonic polyps: Secondary | ICD-10-CM

## 2022-09-01 DIAGNOSIS — F17200 Nicotine dependence, unspecified, uncomplicated: Secondary | ICD-10-CM

## 2022-09-01 LAB — CBC WITH DIFFERENTIAL/PLATELET
Basophils Absolute: 0 10*3/uL (ref 0.0–0.2)
Basos: 1 %
EOS (ABSOLUTE): 0.5 10*3/uL — ABNORMAL HIGH (ref 0.0–0.4)
Eos: 10 %
Hematocrit: 40.8 % (ref 37.5–51.0)
Hemoglobin: 14.1 g/dL (ref 13.0–17.7)
Immature Grans (Abs): 0 10*3/uL (ref 0.0–0.1)
Immature Granulocytes: 0 %
Lymphocytes Absolute: 2.3 10*3/uL (ref 0.7–3.1)
Lymphs: 51 %
MCH: 32.6 pg (ref 26.6–33.0)
MCHC: 34.6 g/dL (ref 31.5–35.7)
MCV: 94 fL (ref 79–97)
Monocytes Absolute: 0.7 10*3/uL (ref 0.1–0.9)
Monocytes: 16 %
Neutrophils Absolute: 1 10*3/uL — ABNORMAL LOW (ref 1.4–7.0)
Neutrophils: 22 %
Platelets: 282 10*3/uL (ref 150–450)
RBC: 4.33 x10E6/uL (ref 4.14–5.80)
RDW: 12.7 % (ref 11.6–15.4)
WBC: 4.5 10*3/uL (ref 3.4–10.8)

## 2022-09-01 LAB — COMPREHENSIVE METABOLIC PANEL
ALT: 26 IU/L (ref 0–44)
AST: 20 IU/L (ref 0–40)
Albumin/Globulin Ratio: 1.9 (ref 1.2–2.2)
Albumin: 4.5 g/dL (ref 3.9–4.9)
Alkaline Phosphatase: 59 IU/L (ref 44–121)
BUN/Creatinine Ratio: 16 (ref 10–24)
BUN: 17 mg/dL (ref 8–27)
Bilirubin Total: 0.4 mg/dL (ref 0.0–1.2)
CO2: 24 mmol/L (ref 20–29)
Calcium: 9.8 mg/dL (ref 8.6–10.2)
Chloride: 104 mmol/L (ref 96–106)
Creatinine, Ser: 1.06 mg/dL (ref 0.76–1.27)
Globulin, Total: 2.4 g/dL (ref 1.5–4.5)
Glucose: 94 mg/dL (ref 70–99)
Potassium: 4.3 mmol/L (ref 3.5–5.2)
Sodium: 143 mmol/L (ref 134–144)
Total Protein: 6.9 g/dL (ref 6.0–8.5)
eGFR: 80 mL/min/{1.73_m2} (ref >59)

## 2022-09-01 LAB — LIPID PANEL
Chol/HDL Ratio: 3.8 ratio (ref 0.0–5.0)
Cholesterol, Total: 123 mg/dL (ref 100–199)
HDL: 32 mg/dL — ABNORMAL LOW (ref >39)
LDL Chol Calc (NIH): 74 mg/dL (ref 0–99)
Triglycerides: 89 mg/dL (ref 0–149)
VLDL Cholesterol Cal: 17 mg/dL (ref 5–40)

## 2022-09-01 MED ORDER — FENOFIBRATE 160 MG PO TABS: 160.0000 mg | ORAL_TABLET | Freq: Every day | ORAL | 3 refills | Status: DC

## 2022-09-01 MED ORDER — AZILSARTAN-CHLORTHALIDONE 40-25 MG PO TABS: 1.0000 | ORAL_TABLET | Freq: Every day | ORAL | 3 refills | Status: DC

## 2022-09-01 MED ORDER — TADALAFIL 20 MG PO TABS: ORAL_TABLET | ORAL | 5 refills | Status: DC

## 2022-09-01 MED ORDER — ATORVASTATIN CALCIUM 80 MG PO TABS: 80.0000 mg | ORAL_TABLET | Freq: Every day | ORAL | 3 refills | Status: DC

## 2022-09-01 NOTE — Patient Instructions (Signed)
Send a MyChart message in 4 months on what her blood pressure is

## 2022-09-01 NOTE — Progress Notes (Signed)
Complete physical exam  Patient: Jason Robbins   DOB: Nov 18, 1960   62 y.o. Male  MRN: [id number]  Subjective:    Chief Complaint  Patient presents with   Annual Exam    Annual physical. Has concerns about urinary urgency.    Jason Robbins is a 62 y.o. male who presents today for a complete physical exam. He reports consuming a general diet. Home exercise routine includes walking 2 times a week. He generally feels fairly well. He reports sleeping fairly well. He does have additional problems to discuss today (urinary urgency).  He states that the urgency is intermittent in nature.  He does have whitecoat hypertension and his numbers at home on his cuff which is accurate are about 20 points lower than here.  His allergies seem to be under good control.  He does have a history of hypogonadism and had been tried on testosterone in the past but presently is on none due to lack of efficacy of the testosterone.  He does have arthritis and is handling this fairly well.  He does smoke over a pack will last about a week.  He does have a history of colonic polyps and is scheduled to have colonoscopy in the near future.   Most recent fall risk assessment:    09/01/2022    3:00 PM  [place] in the past year? 0  Number falls in past yr: 0  Injury with Fall? 0  Risk for fall due to : No Fall Risks  Follow up Falls evaluation completed     Most recent depression screenings:    09/01/2022    3:00 PM 06/08/2021    1:51 PM  PHQ 2/9 Scores  PHQ - 2 Score 0 0  The ASCVD Risk score (Arnett DK, et al., 2019) failed to calculate for the following reasons:   The patient has a prior MI or stroke diagnosis   Vision:Not within last year  and Dental: Receives regular dental care    Patient Care Team: Denita Lung, MD as PCP - General (Family Medicine) Lavonna Monarch, MD (Inactive) as Consulting Physician (Dermatology)   Outpatient Medications Prior to Visit  Medication Sig Note   aspirin  81 MG chewable tablet Chew 1 tablet (81 mg total) by mouth daily.    Chlorpheniramine-PSE-Ibuprofen (ADVIL ALLERGY SINUS PO) Take 1 tablet by mouth as needed (congestion/pain). 09/01/2022: prn   loratadine (CLARITIN) 10 MG tablet Take 10 mg by mouth daily.    meloxicam (MOBIC) 15 MG tablet TAKE 1 TABLET BY MOUTH EVERY DAY 09/01/2022: prn   sodium chloride (OCEAN) 0.65 % SOLN nasal spray Place 1 spray into both nostrils as needed for congestion. 09/01/2022: prn   triamcinolone cream (KENALOG) 0.1 % APPLY 1 APPLICATION TOPICALLY 4 TIMES DAILY AS NEEDED (ITCHY RASH).    [DISCONTINUED] atorvastatin (LIPITOR) 80 MG tablet TAKE 1 TABLET BY MOUTH EVERY DAY    [DISCONTINUED] EDARBYCLOR 40-12.5 MG TABS TAKE 1 TABLET BY MOUTH ONCE DAILY    [DISCONTINUED] fenofibrate 160 MG tablet TAKE 1 TABLET BY MOUTH EVERY DAY    [DISCONTINUED] tadalafil (CIALIS) 20 MG tablet TAKE 1 TABLET BY MOUTH EVERY DAY AS NEEDED FOR ERECTILE DYSFUNCTION    [DISCONTINUED] clobetasol ointment (TEMOVATE) 0.05 % Apply to affected area after bathing (Patient not taking: Reported on 09/01/2022) 09/01/2022: prn   No facility-administered medications prior to visit.    Review of Systems  All other systems reviewed and are negative.  Objective:     BP (!) 170/94 (BP Location: Right Arm, Cuff Size: Large)   Pulse 79   Temp 98.3 F (36.8 C) (Oral)   Resp 18   Ht 5' 10.25" (1.784 m)   Wt 243 lb 12.8 oz (110.6 kg)   SpO2 97% Comment: room air  BMI 34.73 kg/m    Physical Exam  Alert and in no distress. Tympanic membranes and canals are normal. Pharyngeal area is normal. Neck is supple without adenopathy or thyromegaly. Cardiac exam shows a regular sinus rhythm without murmurs or gallops. Lungs are clear to auscultation.      Assessment & Plan:    Routine general medical examination at a health care facility  Allergic rhinitis due to pollen, unspecified seasonality  Hypogonadism in male  Arthritis  Hyperlipidemia  with target LDL less than 130 - Plan: atorvastatin (LIPITOR) 80 MG tablet, fenofibrate 160 MG tablet  History of CVA (cerebrovascular accident)  History of colonic polyps  Erectile dysfunction, unspecified erectile dysfunction type - Plan: tadalafil (CIALIS) 20 MG tablet  Current smoker  Essential hypertension - Plan: Azilsartan-Chlorthalidone 40-25 MG TABS  Immunization History  Administered Date(s) Administered   Influenza,inj,Quad PF,6+ Mos 02/20/2015, 06/14/2016, 04/24/2017, 06/08/2021   Influenza-Unspecified 06/29/2022   Janssen (J&J) SARS-COV-2 Vaccination 09/05/2019   Moderna SARS-COV2 Booster Vaccination 01/19/2021   PPD Test 04/15/1991   Tdap 11/26/2012    Health Maintenance  Topic Date Due   Zoster Vaccines- Shingrix (1 of 2) Never done   COLONOSCOPY (Pts 45-60yr Insurance coverage will need to be confirmed)  12/20/2020   COVID-19 Vaccine (3 - 2023-24 season) 09/17/2022 (Originally 02/25/2022)   DTaP/Tdap/Td (2 - Td or Tdap) 11/27/2022   INFLUENZA VACCINE  Completed   Hepatitis C Screening  Completed   HIV Screening  Completed   HPV VACCINES  Aged Out    Discussed the urgency issue and at this point we will continue to monitor it.  He is to check his blood pressure at home.  I will increase his blood pressure medication and he is to send me a MyChart message in approximately 4 months to let me know what the number is.  Discussed smoking cessation with him and he recognizes that these are more out of habit than true addiction.  He will schedule a colonoscopy. Problem List Items Addressed This Visit     Allergic rhinitis due to pollen   Arthritis   Current smoker   Erectile dysfunction   Relevant Medications   tadalafil (CIALIS) 20 MG tablet   History of colonic polyps   History of CVA (cerebrovascular accident)   Hyperlipidemia with target LDL less than 130   Relevant Medications   atorvastatin (LIPITOR) 80 MG tablet   fenofibrate 160 MG tablet   tadalafil  (CIALIS) 20 MG tablet   Azilsartan-Chlorthalidone 40-25 MG TABS   Hypogonadism in male   Other Visit Diagnoses     Routine general medical examination at a health care facility    -  Primary   Essential hypertension       Relevant Medications   atorvastatin (LIPITOR) 80 MG tablet   fenofibrate 160 MG tablet   tadalafil (CIALIS) 20 MG tablet   Azilsartan-Chlorthalidone 40-25 MG TABS      Follow-up with a MyChart message in 4 months.    JJill Alexanders MD

## 2022-09-02 MED ORDER — MELOXICAM 15 MG PO TABS: 15.0000 mg | ORAL_TABLET | Freq: Every day | ORAL | 1 refills | Status: DC

## 2022-09-02 MED ORDER — TRIAMCINOLONE ACETONIDE 0.1 % EX CREA: TOPICAL_CREAM | CUTANEOUS | 2 refills | Status: AC

## 2022-10-28 ENCOUNTER — Other Ambulatory Visit: Payer: Self-pay | Admitting: Family Medicine

## 2022-10-28 DIAGNOSIS — M1990 Unspecified osteoarthritis, unspecified site: Secondary | ICD-10-CM

## 2022-10-28 NOTE — Telephone Encounter (Signed)
Refill request last apt 09/01/22

## 2022-11-08 ENCOUNTER — Other Ambulatory Visit: Payer: BC Managed Care – PPO

## 2022-11-08 NOTE — Progress Notes (Signed)
   11/08/2022  Patient ID: Jason Robbins, male   DOB: 06-04-1961, 62 y.o.   MRN: [id number]  Subjective/Objective:  HTN -BP 170/90 at 3/7 PCP appointment, and Azilsartan-Chlorthalidone was increased from 40/12.5mg  daily to 40/25mg  daily -Patient has bottles of previous dose remaining at home and does not plan to start dose increase until this is used (approximately mid-June) due to cost -Has already picked up new dose and has on hand -Endorses home BP readings average 140/90 -Very rarely using meloxicam (which can increase BP); maybe once or twice every few weeks  HLD -Improved lipid values (TC 123, LDL 74, HDL 32, TG 89) -States he is still taking ASA 81mg  a few times a week but atorvastatin 80mg  and fenofibrate 160mg  daily  Assessment/Plan:  HTN -Patient unaware Dr. Susann Givens wishes for him to provide home BP readings 4 months after last office visit in March -Instructed him to start increased Edarbyclor dose as soon as other is out -Continue checking home BP regularly and record values  HLD -Controlled -Continue current regimen and regula PCP follow-up  Follow-up: Scheduled telephone visit for 7/15 to obtain home BP readings.  Patient will have been on increased medication dose 1 month at this time.  Will provide values to Dr. Susann Givens and make any follow-up medication recommendations if needed.  Lenna Gilford, PharmD, DPLA

## 2022-11-16 ENCOUNTER — Encounter: Payer: Self-pay | Admitting: Family Medicine

## 2022-12-22 ENCOUNTER — Encounter: Payer: Self-pay | Admitting: Gastroenterology

## 2023-01-03 ENCOUNTER — Other Ambulatory Visit: Payer: Self-pay | Admitting: Family Medicine

## 2023-01-03 DIAGNOSIS — M1990 Unspecified osteoarthritis, unspecified site: Secondary | ICD-10-CM

## 2023-01-09 ENCOUNTER — Other Ambulatory Visit: Payer: BC Managed Care – PPO

## 2023-01-09 NOTE — Progress Notes (Unsigned)
   01/09/2023  Patient ID: Jason Robbins, male   DOB: June 30, 1960, 62 y.o.   MRN: [id number]  S/O: Telephone follow-up to check on HTN medication management  HTN -Patient states he still has some of the azilisartan-chlorthalidone 40/12.5mg  on hand, so he has not yet started taking the increased 40/25mg  dose.  He does have this dose on hand at home, though. -Endorses some concern with increased urination that the dose increase may cause -Questioning if increased urination he is already experience could be prostate related  A/P:  HTN -Suggested that patient go ahead and start increased dose to see how he will tolerate and how it will affect blood pressure.  He is in agreement with this plan and will begin azilisartan-chlorthalidone 40/25mg  daily this week.   -Recommend to monitor and record BP daily -Recommend PSA at follow-up PCP visit, as this has not be evaluated recently  Follow-up:  Telephone follow-up scheduled for next month  Lenna Gilford, PharmD, DPLA

## 2023-02-10 ENCOUNTER — Encounter: Payer: Self-pay | Admitting: Gastroenterology

## 2023-02-20 ENCOUNTER — Other Ambulatory Visit: Payer: BC Managed Care – PPO

## 2023-02-20 NOTE — Progress Notes (Signed)
   02/20/2023  Patient ID: Jason Robbins, male   DOB: 1960-09-27, 62 y.o.   MRN: [id number]  S/O Telephone visit to check on BP control  HTN -Patient began taking increased Edarbyclor dose (40/25mg ) daily after our 7/15 telephone visit -Endorses improved home BP readings ranging from 121-138/88-97 -He is tolerating this dose increase well, with no noticeable increase in urination frequency -Patient anticipates additional BP improvement with recent efforts to increase daily steps  A/P  HTN -Continue to monitor and record home BP readings regularly -Patient was last seen by PCP in March; I recommend HTN f/u and CMP in September -If BP remains >130/80, could consider additional agent such as amlodipine 5mg  daily  Follow-up:  None scheduled at this time but can as recommended per PCP  Lenna Gilford, PharmD, DPLA

## 2023-02-28 ENCOUNTER — Encounter: Payer: BC Managed Care – PPO | Admitting: Gastroenterology

## 2023-03-24 ENCOUNTER — Ambulatory Visit (AMBULATORY_SURGERY_CENTER): Payer: 59

## 2023-03-24 VITALS — Ht 71.0 in | Wt 235.0 lb

## 2023-03-24 DIAGNOSIS — Z86010 Personal history of colonic polyps: Secondary | ICD-10-CM

## 2023-03-24 MED ORDER — NA SULFATE-K SULFATE-MG SULF 17.5-3.13-1.6 GM/177ML PO SOLN: 1.0000 | Freq: Once | ORAL | 0 refills | Status: AC

## 2023-03-24 NOTE — Progress Notes (Signed)
Pre visit completed via phone call; Patient verified name, DOB, and address;  No egg or soy allergy known to patient;  No issues known to pt with past sedation with any surgeries or procedures; Patient denies ever being told they had issues or difficulty with intubation;  No FH of Malignant Hyperthermia; Pt is not on diet pills; Pt is not on home 02;  Pt is not on blood thinners;  Pt denies issues with constipation;  No A fib or A flutter; Have any cardiac testing pending--NO Insurance verified during PV appt--- UHC Pt can ambulate without assistance;  Pt denies use of chewing tobacco Discussed diabetic/weight loss medication holds; Discussed NSAID holds; Checked BMI to be less than 50; Pt instructed to use [employer] or GoodRx for a price reduction on prep  Patient's chart reviewed by Cathlyn Parsons CNRA prior to previsit and patient appropriate for the [facility].  Pre visit completed and red dot placed by patient's name on their procedure day (on provider's schedule).    Instructions sent to patient via MyChart per his request;

## 2023-03-29 ENCOUNTER — Encounter: Payer: Self-pay | Admitting: Gastroenterology

## 2023-04-12 ENCOUNTER — Ambulatory Visit: Payer: 59 | Admitting: Gastroenterology

## 2023-04-12 ENCOUNTER — Encounter: Payer: Self-pay | Admitting: Gastroenterology

## 2023-04-12 VITALS — BP 121/76 | HR 64 | Temp 97.1°F | Resp 16 | Ht 71.0 in | Wt 235.0 lb

## 2023-04-12 DIAGNOSIS — D125 Benign neoplasm of sigmoid colon: Secondary | ICD-10-CM

## 2023-04-12 DIAGNOSIS — Z860100 Personal history of colon polyps, unspecified: Secondary | ICD-10-CM

## 2023-04-12 DIAGNOSIS — Z09 Encounter for follow-up examination after completed treatment for conditions other than malignant neoplasm: Secondary | ICD-10-CM | POA: Diagnosis present

## 2023-04-12 DIAGNOSIS — K635 Polyp of colon: Secondary | ICD-10-CM | POA: Diagnosis not present

## 2023-04-12 DIAGNOSIS — D123 Benign neoplasm of transverse colon: Secondary | ICD-10-CM | POA: Diagnosis not present

## 2023-04-12 DIAGNOSIS — Z860101 Personal history of adenomatous and serrated colon polyps: Secondary | ICD-10-CM

## 2023-04-12 MED ORDER — SODIUM CHLORIDE 0.9 % IV SOLN: 500.0000 mL | Freq: Once | INTRAVENOUS | Status: DC

## 2023-04-12 NOTE — Progress Notes (Signed)
Called to room to assist during endoscopic procedure.  Patient ID and intended procedure confirmed with present staff. Received instructions for my participation in the procedure from the performing physician.  

## 2023-04-12 NOTE — Op Note (Signed)
[facility] Patient Name: Jason Robbins Procedure Date: 04/12/2023 8:38 AM MRN: [id number] Endoscopist: Meryl Dare , MD, [phone] Age: 62 Referring MD:  Date of Birth: [birth year] Gender: Male Account #: [id number] Procedure:                Colonoscopy Indications:              Surveillance: Personal history of adenomatous                            polyps on last colonoscopy > 5 years ago Medicines:                Monitored Anesthesia Care Procedure:                Pre-Anesthesia Assessment:                           - Prior to the procedure, a History and Physical                            was performed, and patient medications and                            allergies were reviewed. The patient's tolerance of                            previous anesthesia was also reviewed. The risks                            and benefits of the procedure and the sedation                            options and risks were discussed with the patient.                            All questions were answered, and informed consent                            was obtained. Prior Anticoagulants: The patient has                            taken no anticoagulant or antiplatelet agents. ASA                            Grade Assessment: II - A patient with mild systemic                            disease. After reviewing the risks and benefits,                            the patient was deemed in satisfactory condition to                            undergo the procedure.  After obtaining informed consent, the colonoscope                            was passed under direct vision. Throughout the                            procedure, the patient's blood pressure, pulse, and                            oxygen saturations were monitored continuously. The                            CF [id number] #[id number] was introduced through the anus                            and advanced to the  the cecum, identified by                            appendiceal orifice and ileocecal valve. The                            ileocecal valve, appendiceal orifice, and rectum                            were photographed. The quality of the bowel                            preparation was good. The colonoscopy was performed                            without difficulty. The patient tolerated the                            procedure well. Scope In: 8:48:05 AM Scope Out: 8:59:00 AM Scope Withdrawal Time: 0 hours 9 minutes 34 seconds  Total Procedure Duration: 0 hours 10 minutes 55 seconds  Findings:                 The perianal and digital rectal examinations were                            normal.                           Two sessile polyps were found in the sigmoid colon                            and transverse colon. The polyps were 6 to 7 mm in                            size. These polyps were removed with a cold snare.                            Resection and retrieval were complete.  Internal hemorrhoids were found during                            retroflexion. The hemorrhoids were small and Grade                            I (internal hemorrhoids that do not prolapse).                           The exam was otherwise without abnormality on                            direct and retroflexion views. Complications:            No immediate complications. Estimated blood loss:                            None. Estimated Blood Loss:     Estimated blood loss: none. Impression:               - Two 6 to 7 mm polyps in the sigmoid colon and in                            the transverse colon, removed with a cold snare.                            Resected and retrieved.                           - Internal hemorrhoids. Recommendation:           - Repeat colonoscopy date to be determined after                            pending pathology results are reviewed for                             surveillance based on pathology results.                           - Patient has a contact number available for                            emergencies. The signs and symptoms of potential                            delayed complications were discussed with the                            patient. Return to normal activities tomorrow.                            Written discharge instructions were provided to the                            patient.                           -  Resume previous diet.                           - Continue present medications.                           - Await pathology results. Meryl Dare, MD 04/12/2023 9:04:33 AM This report has been signed electronically.

## 2023-04-12 NOTE — Progress Notes (Signed)
Sedate, gd SR, tolerated procedure well, VSS, report to RN 

## 2023-04-12 NOTE — Progress Notes (Signed)
Pt's states no medical or surgical changes since previsit or office visit. VS assessed by C.W 

## 2023-04-12 NOTE — Patient Instructions (Addendum)
Patient has a contact number available for                            emergencies. The signs and symptoms of potential                            delayed complications were discussed with the                            patient. Return to normal activities tomorrow.                            Written discharge instructions were provided to the                            patient.                           - Resume previous diet.                           - Continue present medications.                           - Await pathology results. Handout on polyps and hemorrhoids given.    YOU HAD AN ENDOSCOPIC PROCEDURE TODAY AT THE [facility]:   Refer to the procedure report that was given to you for any specific questions about what was found during the examination.  If the procedure report does not answer your questions, please call your gastroenterologist to clarify.  If you requested that your care partner not be given the details of your procedure findings, then the procedure report has been included in a sealed envelope for you to review at your convenience later.  YOU SHOULD EXPECT: Some feelings of bloating in the abdomen. Passage of more gas than usual.  Walking can help get rid of the air that was put into your GI tract during the procedure and reduce the bloating. If you had a lower endoscopy (such as a colonoscopy or flexible sigmoidoscopy) you may notice spotting of blood in your stool or on the toilet paper. If you underwent a bowel prep for your procedure, you may not have a normal bowel movement for a few days.  Please Note:  You might notice some irritation and congestion in your nose or some drainage.  This is from the oxygen used during your procedure.  There is no need for concern and it should clear up in a day or so.  SYMPTOMS TO REPORT IMMEDIATELY:  Following lower endoscopy (colonoscopy or flexible sigmoidoscopy):  Excessive amounts of blood in the  stool  Significant tenderness or worsening of abdominal pains  Swelling of the abdomen that is new, acute  Fever of 100F or higher   For urgent or emergent issues, a gastroenterologist can be reached at any hour by calling [phone]. Do not use MyChart messaging for urgent concerns.    DIET:  We do recommend a small meal at first, but then you may proceed to your regular diet.  Drink plenty of fluids but you should avoid alcoholic beverages for 24 hours.  ACTIVITY:  You should plan  to take it easy for the rest of today and you should NOT DRIVE or use heavy machinery until tomorrow (because of the sedation medicines used during the test).    FOLLOW UP: Our staff will call the number listed on your records the next business day following your procedure.  We will call around 7:15- 8:00 am to check on you and address any questions or concerns that you may have regarding the information given to you following your procedure. If we do not reach you, we will leave a message.     If any biopsies were taken you will be contacted by phone or by letter within the next 1-3 weeks.  Please call us at [phone] if you have not heard about the biopsies in 3 weeks.    SIGNATURES/CONFIDENTIALITY: You and/or your care partner have signed paperwork which will be entered into your electronic medical record.  These signatures attest to the fact that that the information above on your After Visit Summary has been reviewed and is understood.  Full responsibility of the confidentiality of this discharge information lies with you and/or your care-partner.

## 2023-04-12 NOTE — Progress Notes (Signed)
History & Physical  Primary Care Physician:  Ronnald Nian, MD Primary Gastroenterologist: Claudette Head, MD  Impression / Plan:  Personal history of adenomatous colon polyps for surveillance colonoscopy.  CHIEF COMPLAINT:  Personal history of colon polyps   HPI: Jason Robbins is a 62 y.o. male with a personal history of adenomatous colon polyps for surveillance colonoscopy.    Past Medical History:  Diagnosis Date   Cholelithiasis    Dyslipidemia    GERD (gastroesophageal reflux disease)    Herpes labialis    Hyperlipidemia    on meds   Hypertension    on meds   Psoriasis    Renal stone    Seasonal allergies    Smoker    Stroke [facility]) 2021   Tubular adenoma of colon 12/21/2015    Past Surgical History:  Procedure Laterality Date   COLONOSCOPY  2017   MS-MAC-suprep(adeq)-TA-5 yr recall- needs 2 day prep on next one   KIDNEY STONE SURGERY     NASAL SINUS SURGERY      Prior to Admission medications   Medication Sig Start Date End Date Taking? Authorizing Provider  aspirin 81 MG chewable tablet Chew 1 tablet (81 mg total) by mouth daily. 07/07/20  Yes Mikhail, Maryann, DO  atorvastatin (LIPITOR) 80 MG tablet Take 1 tablet (80 mg total) by mouth daily. 09/01/22  Yes Ronnald Nian, MD  Azilsartan-Chlorthalidone 40-25 MG TABS Take 1 tablet by mouth daily. 09/01/22  Yes Ronnald Nian, MD  fenofibrate 160 MG tablet Take 1 tablet (160 mg total) by mouth daily. 09/01/22  Yes Ronnald Nian, MD  loratadine (CLARITIN) 10 MG tablet Take 10 mg by mouth daily as needed for allergies.   Yes [provider]  Chlorpheniramine-PSE-Ibuprofen (ADVIL ALLERGY SINUS PO) Take 1 tablet by mouth as needed (congestion/pain).    [provider]  meloxicam (MOBIC) 15 MG tablet TAKE 1 TABLET (15 MG TOTAL) BY MOUTH DAILY. Patient not taking: Reported on 04/12/2023 01/03/23   Ronnald Nian, MD  sodium chloride (OCEAN) 0.65 % SOLN nasal spray Place 1 spray into both nostrils as  needed for congestion. Patient not taking: Reported on 04/12/2023    [provider]  tadalafil (CIALIS) 20 MG tablet TAKE 1 TABLET BY MOUTH EVERY DAY AS NEEDED FOR ERECTILE DYSFUNCTION 09/01/22   Ronnald Nian, MD  triamcinolone cream (KENALOG) 0.1 % APPLY 1 APPLICATION TOPICALLY 4 TIMES DAILY AS NEEDED (ITCHY RASH). 09/02/22   Ronnald Nian, MD    Current Outpatient Medications  Medication Sig Dispense Refill   aspirin 81 MG chewable tablet Chew 1 tablet (81 mg total) by mouth daily. 30 tablet    atorvastatin (LIPITOR) 80 MG tablet Take 1 tablet (80 mg total) by mouth daily. 90 tablet 3   Azilsartan-Chlorthalidone 40-25 MG TABS Take 1 tablet by mouth daily. 90 tablet 3   fenofibrate 160 MG tablet Take 1 tablet (160 mg total) by mouth daily. 90 tablet 3   loratadine (CLARITIN) 10 MG tablet Take 10 mg by mouth daily as needed for allergies.     Chlorpheniramine-PSE-Ibuprofen (ADVIL ALLERGY SINUS PO) Take 1 tablet by mouth as needed (congestion/pain).     meloxicam (MOBIC) 15 MG tablet TAKE 1 TABLET (15 MG TOTAL) BY MOUTH DAILY. (Patient not taking: Reported on 04/12/2023) 30 tablet 1   sodium chloride (OCEAN) 0.65 % SOLN nasal spray Place 1 spray into both nostrils as needed for congestion. (Patient not taking: Reported on 04/12/2023)  tadalafil (CIALIS) 20 MG tablet TAKE 1 TABLET BY MOUTH EVERY DAY AS NEEDED FOR ERECTILE DYSFUNCTION 30 tablet 5   triamcinolone cream (KENALOG) 0.1 % APPLY 1 APPLICATION TOPICALLY 4 TIMES DAILY AS NEEDED (ITCHY RASH). 30 g 2   Current Facility-Administered Medications  Medication Dose Route Frequency Provider Last Rate Last Admin   0.9 %  sodium chloride infusion  500 mL Intravenous Once Meryl Dare, MD        Allergies as of 04/12/2023 - Review Complete 04/12/2023  Allergen Reaction Noted   Sulfa antibiotics Swelling 01/19/2011    Family History  Problem Relation Age of Onset   Hypertension Mother    Stroke Mother    Cancer Mother         biliary duct cancer   Hypertension Father    Stroke Father    Hypertension Sister    Heart disease Sister    Other Sister        CADASIL    Colon cancer Neg Hx    Colon polyps Neg Hx    Esophageal cancer Neg Hx    Rectal cancer Neg Hx    Stomach cancer Neg Hx     Social History   Socioeconomic History   Marital status: Married    Spouse name: Not on file   Number of children: Not on file   Years of education: Not on file   Highest education level: Not on file  Occupational History   Not on file  Tobacco Use   Smoking status: Former    Current packs/day: 0.25    Types: Cigarettes   Smokeless tobacco: Never   Tobacco comments:    1 pack monthly; uses e-cigs inbetween; trying to quit  Vaping Use   Vaping status: Every Day  Substance and Sexual Activity   Alcohol use: Yes    Alcohol/week: 0.0 standard drinks of alcohol    Comment: holidays   Drug use: No   Sexual activity: Yes  Other Topics Concern   Not on file  Social History Narrative   Not on file   Social Determinants of Health   Financial Resource Strain: Not on file  Food Insecurity: Not on file  Transportation Needs: Not on file  Physical Activity: Not on file  Stress: Not on file  Social Connections: Not on file  Intimate Partner Violence: Not on file    Review of Systems:  All systems reviewed were negative except where noted in HPI.   Physical Exam:  General:  Alert, well-developed, in NAD Head:  Normocephalic and atraumatic. Eyes:  Sclera clear, no icterus.   Conjunctiva pink. Ears:  Normal auditory acuity. Mouth:  No deformity or lesions.  Neck:  Supple; no masses. Lungs:  Clear throughout to auscultation.   No wheezes, crackles, or rhonchi.  Heart:  Regular rate and rhythm; no murmurs. Abdomen:  Soft, nondistended, nontender. No masses, hepatomegaly. No palpable masses.  Normal bowel sounds.    Rectal:  Deferred   Msk:  Symmetrical without gross deformities. Extremities:  Without  edema. Neurologic:  Alert and  oriented x 4; grossly normal neurologically. Skin:  Intact without significant lesions or rashes. Psych:  Alert and cooperative. Normal mood and affect.   Venita Lick. Russella Dar  04/12/2023, 8:43 AM See Loretha Stapler, [facility], to contact our on call provider

## 2023-04-13 ENCOUNTER — Telehealth: Payer: Self-pay

## 2023-04-13 NOTE — Telephone Encounter (Signed)
LMOM

## 2023-04-14 LAB — SURGICAL PATHOLOGY

## 2023-04-17 ENCOUNTER — Encounter: Payer: Self-pay | Admitting: Gastroenterology

## 2023-08-22 ENCOUNTER — Encounter: Payer: Self-pay | Admitting: Internal Medicine

## 2023-08-23 ENCOUNTER — Encounter: Payer: Self-pay | Admitting: Family Medicine

## 2023-09-03 ENCOUNTER — Other Ambulatory Visit: Payer: Self-pay | Admitting: Family Medicine

## 2023-09-03 DIAGNOSIS — E785 Hyperlipidemia, unspecified: Secondary | ICD-10-CM

## 2023-09-05 ENCOUNTER — Encounter: Payer: Self-pay | Admitting: Internal Medicine

## 2023-09-05 ENCOUNTER — Other Ambulatory Visit: Payer: Self-pay | Admitting: Family Medicine

## 2023-09-05 DIAGNOSIS — E785 Hyperlipidemia, unspecified: Secondary | ICD-10-CM

## 2023-09-06 MED ORDER — ATORVASTATIN CALCIUM 80 MG PO TABS: 80.0000 mg | ORAL_TABLET | Freq: Every day | ORAL | 0 refills | Status: DC

## 2023-09-06 MED ORDER — ATORVASTATIN CALCIUM 80 MG PO TABS: 80.0000 mg | ORAL_TABLET | Freq: Every day | ORAL | 3 refills | Status: DC

## 2023-09-06 NOTE — Addendum Note (Signed)
 Addended by: Herminio Commons A on: 09/06/2023 02:57 PM   Modules accepted: Orders

## 2023-10-05 ENCOUNTER — Other Ambulatory Visit: Payer: Self-pay | Admitting: Family Medicine

## 2023-10-05 DIAGNOSIS — N529 Male erectile dysfunction, unspecified: Secondary | ICD-10-CM

## 2023-10-17 ENCOUNTER — Encounter: Payer: Self-pay | Admitting: Family Medicine

## 2023-10-22 ENCOUNTER — Encounter: Payer: Self-pay | Admitting: Family Medicine

## 2023-10-23 ENCOUNTER — Telehealth: Payer: Self-pay | Admitting: *Deleted

## 2023-10-23 ENCOUNTER — Other Ambulatory Visit: Payer: Self-pay | Admitting: Family Medicine

## 2023-10-23 DIAGNOSIS — I10 Essential (primary) hypertension: Secondary | ICD-10-CM

## 2023-10-23 NOTE — Telephone Encounter (Signed)
 Copied from CRM [id number]. Topic: Clinical - Medication Question >> Oct 23, 2023 10:23 AM Everette C wrote: Reason for CRM: The patient would like to know if there are samples of EDARBYCLOR  40-25 MG TABS [[id number]] available for them to pick up today form the practice until their prescription arrives  I called patient and let him know that I put samples up front for him.

## 2024-01-09 ENCOUNTER — Other Ambulatory Visit

## 2024-01-09 DIAGNOSIS — E785 Hyperlipidemia, unspecified: Secondary | ICD-10-CM

## 2024-01-09 DIAGNOSIS — L409 Psoriasis, unspecified: Secondary | ICD-10-CM

## 2024-01-09 DIAGNOSIS — I10 Essential (primary) hypertension: Secondary | ICD-10-CM

## 2024-01-09 LAB — CBC WITH DIFFERENTIAL/PLATELET
Basophils Absolute: 0 x10E3/uL (ref 0.0–0.2)
Basos: 1 %
EOS (ABSOLUTE): 0.5 x10E3/uL — ABNORMAL HIGH (ref 0.0–0.4)
Eos: 9 %
Hematocrit: 41.9 % (ref 37.5–51.0)
Hemoglobin: 13.6 g/dL (ref 13.0–17.7)
Immature Grans (Abs): 0 x10E3/uL (ref 0.0–0.1)
Immature Granulocytes: 0 %
Lymphocytes Absolute: 2.6 x10E3/uL (ref 0.7–3.1)
Lymphs: 47 %
MCH: 32.3 pg (ref 26.6–33.0)
MCHC: 32.5 g/dL (ref 31.5–35.7)
MCV: 100 fL — ABNORMAL HIGH (ref 79–97)
Monocytes Absolute: 0.8 x10E3/uL (ref 0.1–0.9)
Monocytes: 16 %
Neutrophils Absolute: 1.4 x10E3/uL (ref 1.4–7.0)
Neutrophils: 27 %
Platelets: 289 x10E3/uL (ref 150–450)
RBC: 4.21 x10E6/uL (ref 4.14–5.80)
RDW: 13 % (ref 11.6–15.4)
WBC: 5.4 x10E3/uL (ref 3.4–10.8)

## 2024-01-09 LAB — COMPREHENSIVE METABOLIC PANEL WITH GFR
ALT: 21 IU/L (ref 0–44)
AST: 15 IU/L (ref 0–40)
Albumin: 4.1 g/dL (ref 3.9–4.9)
Alkaline Phosphatase: 61 IU/L (ref 44–121)
BUN/Creatinine Ratio: 11 (ref 10–24)
BUN: 15 mg/dL (ref 8–27)
Bilirubin Total: 0.4 mg/dL (ref 0.0–1.2)
CO2: 22 mmol/L (ref 20–29)
Calcium: 9.8 mg/dL (ref 8.6–10.2)
Chloride: 101 mmol/L (ref 96–106)
Creatinine, Ser: 1.31 mg/dL — ABNORMAL HIGH (ref 0.76–1.27)
Globulin, Total: 2.6 g/dL (ref 1.5–4.5)
Glucose: 88 mg/dL (ref 70–99)
Potassium: 3.9 mmol/L (ref 3.5–5.2)
Sodium: 138 mmol/L (ref 134–144)
Total Protein: 6.7 g/dL (ref 6.0–8.5)
eGFR: 61 mL/min/1.73 (ref >59)

## 2024-01-09 LAB — LIPID PANEL
Chol/HDL Ratio: 4.4 ratio (ref 0.0–5.0)
Cholesterol, Total: 123 mg/dL (ref 100–199)
HDL: 28 mg/dL — ABNORMAL LOW (ref >39)
LDL Chol Calc (NIH): 76 mg/dL (ref 0–99)
Triglycerides: 97 mg/dL (ref 0–149)
VLDL Cholesterol Cal: 19 mg/dL (ref 5–40)

## 2024-01-10 ENCOUNTER — Ambulatory Visit: Admitting: Family Medicine

## 2024-01-10 VITALS — BP 140/90 | HR 79 | Ht 71.0 in | Wt 254.0 lb

## 2024-01-10 DIAGNOSIS — I10 Essential (primary) hypertension: Secondary | ICD-10-CM | POA: Diagnosis not present

## 2024-01-10 DIAGNOSIS — E785 Hyperlipidemia, unspecified: Secondary | ICD-10-CM

## 2024-01-10 DIAGNOSIS — Z0000 Encounter for general adult medical examination without abnormal findings: Secondary | ICD-10-CM | POA: Diagnosis not present

## 2024-01-10 DIAGNOSIS — N5201 Erectile dysfunction due to arterial insufficiency: Secondary | ICD-10-CM

## 2024-01-10 DIAGNOSIS — Z23 Encounter for immunization: Secondary | ICD-10-CM

## 2024-01-10 DIAGNOSIS — M1990 Unspecified osteoarthritis, unspecified site: Secondary | ICD-10-CM

## 2024-01-10 DIAGNOSIS — F17200 Nicotine dependence, unspecified, uncomplicated: Secondary | ICD-10-CM

## 2024-01-10 DIAGNOSIS — Z860100 Personal history of colon polyps, unspecified: Secondary | ICD-10-CM

## 2024-01-10 DIAGNOSIS — E291 Testicular hypofunction: Secondary | ICD-10-CM | POA: Diagnosis not present

## 2024-01-10 DIAGNOSIS — L409 Psoriasis, unspecified: Secondary | ICD-10-CM

## 2024-01-10 DIAGNOSIS — J301 Allergic rhinitis due to pollen: Secondary | ICD-10-CM

## 2024-01-10 DIAGNOSIS — Z8673 Personal history of transient ischemic attack (TIA), and cerebral infarction without residual deficits: Secondary | ICD-10-CM

## 2024-01-10 DIAGNOSIS — Z125 Encounter for screening for malignant neoplasm of prostate: Secondary | ICD-10-CM

## 2024-01-10 DIAGNOSIS — N3281 Overactive bladder: Secondary | ICD-10-CM | POA: Insufficient documentation

## 2024-01-10 MED ORDER — MELOXICAM 15 MG PO TABS: 15.0000 mg | ORAL_TABLET | Freq: Every day | ORAL | 1 refills | Status: DC

## 2024-01-10 NOTE — Progress Notes (Unsigned)
 Complete physical exam  Patient: Jason Robbins   DOB: [birth year]   63 y.o. Male  MRN: [id number]  Subjective:    Chief Complaint  Patient presents with   Annual Exam    Cpe. When he feels like he has to urine he has to go immediately.  Needs refill for triamcinolone  cream (KENALOG ) 0.1 %.     Jason Robbins is a 63 y.o. male who presents today for a complete physical exam.  He reports consuming a general diet. Walking 3 to 4 times. He generally feels well. He reports sleeping fairly well.  He notes that he has some difficulty recently with overactive bladder symptoms in the feeling of having to go but so far has been able to keep some having incontinence of urine.  He would also like to have his PSA checked.  He has had some difficulty with ED but states that he then tried to Cialis  usually within an hour of activity which did not work well for him.  He continues on his Lipitor as well as fenofibrate .  Continues on his blood pressure medications he would like a refill on his meloxicam  because he does take this intermittently for arthritic type pain.  Allergies seem to be under good control.  He continues to smoke and is still not interested in quitting.  Does have a history of colonic polyps and is scheduled for routine follow-up on this.  He is not having any psoriasis symptoms.  He is now semiretired.  Still not ready to quit working entirely.  Most recent fall risk assessment:    01/10/2024    1:54 PM  Fall Risk   Falls in the past year? 0  Number falls in past yr: 0  Injury with Fall? 0  Risk for fall due to : No Fall Risks  Follow up Falls evaluation completed     Most recent depression screenings:    01/10/2024    1:54 PM 09/01/2022    3:00 PM  PHQ 2/9 Scores  PHQ - 2 Score 0 0    Vision:Within last year and Dental: No current dental problems and Receives regular dental care  {History (Optional):23778}  Immunization History  Administered Date(s) Administered   Influenza,  Seasonal, Injecte, Preservative Fre 04/21/2023   Influenza,inj,Quad PF,6+ Mos 02/20/2015, 06/14/2016, 04/24/2017, 06/08/2021   Influenza-Unspecified 06/29/2022   Janssen (J&J) SARS-COV-2 Vaccination 09/05/2019   Moderna SARS-COV2 Booster Vaccination 01/19/2021   PPD Test 04/15/1991   Tdap 11/26/2012    Health Maintenance  Topic Date Due   Zoster Vaccines- Shingrix (1 of 2) Never done   DTaP/Tdap/Td (2 - Td or Tdap) 11/27/2022   COVID-19 Vaccine (3 - 2024-25 season) 02/26/2023   Pneumococcal Vaccine 87-45 Years old (1 of 2 - PCV) 06/25/2026 (Originally 12/07/1979)   INFLUENZA VACCINE  01/26/2024   Colonoscopy  04/11/2030   Hepatitis C Screening  Completed   HIV Screening  Completed   Hepatitis B Vaccines  Aged Out   HPV VACCINES  Aged Out   Meningococcal B Vaccine  Aged Out    Patient Care Team: Joyce Norleen BROCKS, MD as PCP - General (Family Medicine) Livingston Rigg, MD (Inactive) as Consulting Physician (Dermatology)   Outpatient Medications Prior to Visit  Medication Sig   aspirin  81 MG chewable tablet Chew 1 tablet (81 mg total) by mouth daily.   atorvastatin  (LIPITOR) 80 MG tablet Take 1 tablet (80 mg total) by mouth daily.   Chlorpheniramine-PSE-Ibuprofen (ADVIL ALLERGY SINUS PO) Take  1 tablet by mouth as needed (congestion/pain).   EDARBYCLOR  40-25 MG TABS TAKE 1 TABLET BY MOUTH ONCE DAILY   fenofibrate  160 MG tablet TAKE 1 TABLET BY MOUTH EVERY DAY   loratadine (CLARITIN) 10 MG tablet Take 10 mg by mouth daily as needed for allergies.   meloxicam  (MOBIC ) 15 MG tablet TAKE 1 TABLET (15 MG TOTAL) BY MOUTH DAILY.   sodium chloride  (OCEAN) 0.65 % SOLN nasal spray Place 1 spray into both nostrils as needed for congestion.   tadalafil  (CIALIS ) 20 MG tablet TAKE 1 TABLET BY MOUTH EVERY DAY AS NEEDED FOR ERECTILE DYSFUNCTION   triamcinolone  cream (KENALOG ) 0.1 % APPLY 1 APPLICATION TOPICALLY 4 TIMES DAILY AS NEEDED (ITCHY RASH).   No facility-administered medications prior to  visit.    Review of Systems  All other systems reviewed and are negative.   Family and social history as well as health maintenance and immunizations was reviewed.     Objective:    {Vitals History (Optional):23777}  Physical Exam  Alert and in no distress. Tympanic membranes and canals are normal. Pharyngeal area is normal. Neck is supple without adenopathy or thyromegaly. Cardiac exam shows a regular sinus rhythm without murmurs or gallops. Lungs are clear to auscultation.  {Show previous labs (optional):23779}    Assessment & Plan:    Discussed health benefits of physical activity, and encouraged him to engage in regular exercise appropriate for his age and condition.  Routine general medical examination at a health care facility  Psoriasis  Hypogonadism in male  Primary hypertension  Hyperlipidemia with target LDL less than 130  History of CVA (cerebrovascular accident)  History of colonic polyps  Erectile dysfunction due to arterial insufficiency  Current smoker  Arthritis  Seasonal allergic rhinitis due to pollen  Need for Tdap vaccination  OAB (overactive bladder)  Screening for prostate cancer  I will follow-up on his testosterone  level as well as prostate.  He has routine follow-up except for colon cancer screening.  Discussed the ED with him in regard of using the Cialis  for least an hour ahead of time and also discussed smoking cessation especially in regard to ED.  He will start using meloxicam  again.  I also talked to him about OAB symptoms and recommended Kegel exercises and if continued difficulty he is to call for further evaluation and possible treatment.  Also discussed getting Shingrix and pneumonia shots however he deferred. Return in about 1 year (around 01/09/2025).      Norleen Jobs, MD

## 2024-01-11 ENCOUNTER — Ambulatory Visit: Payer: Self-pay | Admitting: Family Medicine

## 2024-01-12 ENCOUNTER — Other Ambulatory Visit: Payer: Self-pay | Admitting: Family Medicine

## 2024-01-12 DIAGNOSIS — E785 Hyperlipidemia, unspecified: Secondary | ICD-10-CM

## 2024-01-16 LAB — SPECIMEN STATUS REPORT

## 2024-01-16 LAB — TESTOSTERONE: Testosterone: 481 ng/dL (ref 264–916)

## 2024-01-16 LAB — PSA: Prostate Specific Ag, Serum: 0.9 ng/mL (ref 0.0–4.0)

## 2024-02-21 ENCOUNTER — Encounter: Payer: Self-pay | Admitting: Family Medicine

## 2024-02-21 NOTE — Telephone Encounter (Signed)
 Sched pt with Dr. Vita for tomorrow.

## 2024-02-22 ENCOUNTER — Encounter: Payer: Self-pay | Admitting: Family Medicine

## 2024-02-22 ENCOUNTER — Ambulatory Visit
Admission: RE | Admit: 2024-02-22 | Discharge: 2024-02-22 | Disposition: A | Discharge status: Home / Self Care | Source: Ambulatory Visit | Attending: Family Medicine | Admitting: Family Medicine

## 2024-02-22 ENCOUNTER — Ambulatory Visit: Admitting: Family Medicine

## 2024-02-22 VITALS — BP 130/80 | HR 81 | Wt 254.0 lb

## 2024-02-22 DIAGNOSIS — M25562 Pain in left knee: Secondary | ICD-10-CM

## 2024-02-22 DIAGNOSIS — M25561 Pain in right knee: Secondary | ICD-10-CM | POA: Diagnosis not present

## 2024-02-22 DIAGNOSIS — M170 Bilateral primary osteoarthritis of knee: Secondary | ICD-10-CM | POA: Diagnosis not present

## 2024-02-22 DIAGNOSIS — G8929 Other chronic pain: Secondary | ICD-10-CM

## 2024-02-22 NOTE — Progress Notes (Signed)
 Name: Jason Robbins   Date of Visit: 02/22/24   Date of last visit with me: Visit date not found   CHIEF COMPLAINT:  Chief Complaint  Patient presents with   Acute Visit    Knees        HPI:  Discussed the use of AI scribe software for clinical note transcription with the patient, who gave verbal consent to proceed.  History of Present Illness   Jason Robbins is a 63 year old male with osteoarthritis who presents with worsening knee pain and swelling.  He has been experiencing worsening knee pain and swelling, particularly in the left knee, over the past year. The pain is sometimes almost disabling and has become problematic, especially since all his bedrooms are upstairs. The left knee is often stiff and swollen. He uses ice packs and heat for relief and has tried Voltaren gel, which helps but is used sparingly due to concerns about his past stroke and potential side effects.  He has a history of osteoarthritis in his knuckles and was prescribed meloxicam , which he takes at full strength for five days to manage flare-ups in his hands. This usually quiets down the symptoms. However, he has not been taking it regularly for his knees, occasionally taking half a pill when symptoms arise. He is unsure if the knee symptoms are due to arthritis but reports that the left knee is more affected than the right.  He is right leg dominant but reports more issues with the left knee. A history of a stroke a couple of years ago makes him cautious about using medications like meloxicam  and Voltaren gel due to potential blood pressure increases and stroke risk. He applies Voltaren gel about once a week when the pain is severe.  His knee pain and swelling have impacted his ability to exercise, leading to weight gain. He used to walk regularly but has been avoiding hills and longer routes due to knee discomfort. He has considered using knee compression sleeves but has not yet made that change.          OBJECTIVE:       01/10/2024    1:54 PM  Depression screen PHQ 2/9  Decreased Interest 0  Down, Depressed, Hopeless 0  PHQ - 2 Score 0     BP Readings from Last 3 Encounters:  02/22/24 130/80  01/10/24 (!) 140/90  04/12/23 121/76    BP 130/80   Pulse 81   Wt 254 lb (115.2 kg)   SpO2 98%   BMI 35.43 kg/m    Physical Exam   MUSCULOSKELETAL: Prominent tibial bone. Swelling in left knee, mild swelling in right knee.      Physical Exam  Inspection reveals some swelling of the bilateral knees with the left being more pronounced than the right.  There is mild tenderness to palpation over the medial lateral joint lines of the left knee.  Range of motion is still full in the both knees.  There is some noted tibial prominence bilaterally. Mild warmth  ASSESSMENT/PLAN:   Assessment & Plan Primary osteoarthritis of both knees  Chronic pain of both knees    Assessment and Plan    Bilateral primary osteoarthritis of knee Chronic bilateral knee pain with swelling, more pronounced in the left knee, consistent with osteoarthritis and acute inflammation. Concerns about medication use due to stroke history and potential hypertension. - Order bilateral knee x-rays at [facility] to confirm diagnosis and assess severity. - Instruct to  take meloxicam  15 mg daily for 10-14 days with food to manage acute inflammation. - Advise wearing knee compression sleeves during the day to reduce swelling and provide stability. - Recommend icing knees three times a day for 20 minutes to reduce inflammation. - Encourage resumption of strength training exercises focusing on quadriceps once acute inflammation subsides. - Discuss potential future interventions, including injections or knee replacement, if symptoms do not improve.         Mohid Furuya A. Vita MD [facility]

## 2024-02-23 ENCOUNTER — Ambulatory Visit: Payer: Self-pay | Admitting: Family Medicine

## 2024-02-27 ENCOUNTER — Encounter: Payer: Self-pay | Admitting: Family Medicine

## 2024-03-09 ENCOUNTER — Other Ambulatory Visit: Payer: Self-pay | Admitting: Family Medicine

## 2024-03-09 DIAGNOSIS — M1990 Unspecified osteoarthritis, unspecified site: Secondary | ICD-10-CM

## 2024-05-09 ENCOUNTER — Other Ambulatory Visit: Payer: Self-pay | Admitting: Family Medicine

## 2024-05-09 DIAGNOSIS — E785 Hyperlipidemia, unspecified: Secondary | ICD-10-CM

## 2024-05-13 ENCOUNTER — Other Ambulatory Visit: Payer: Self-pay | Admitting: Family Medicine

## 2024-05-13 DIAGNOSIS — M1990 Unspecified osteoarthritis, unspecified site: Secondary | ICD-10-CM

## 2024-07-19 ENCOUNTER — Other Ambulatory Visit: Payer: Self-pay | Admitting: Family Medicine

## 2024-07-19 DIAGNOSIS — M1990 Unspecified osteoarthritis, unspecified site: Secondary | ICD-10-CM

## 2025-01-15 ENCOUNTER — Encounter: Payer: Self-pay | Admitting: Family Medicine
